# Patient Record
Sex: Male | Born: 1995 | Race: Black or African American | Hispanic: No | Marital: Single | Smoking: Current some day smoker
Health system: Southern US, Community
[De-identification: ages and names within clinical notes are randomized; demographics above are authoritative.]

## PROBLEM LIST (undated history)

## (undated) ENCOUNTER — Emergency Department (HOSPITAL_COMMUNITY): Payer: Self-pay

## (undated) DIAGNOSIS — J45909 Unspecified asthma, uncomplicated: Secondary | ICD-10-CM

## (undated) HISTORY — PX: TRACHEOSTOMY: SUR1362

---
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
## 2017-01-08 ENCOUNTER — Encounter (HOSPITAL_COMMUNITY): Payer: Self-pay | Admitting: Emergency Medicine

## 2017-01-08 ENCOUNTER — Emergency Department (HOSPITAL_COMMUNITY): Payer: Self-pay

## 2017-01-08 ENCOUNTER — Emergency Department (HOSPITAL_COMMUNITY)
Admission: EM | Admit: 2017-01-08 | Discharge: 2017-01-08 | Disposition: A | Discharge status: Home / Self Care | Payer: Self-pay | Attending: Emergency Medicine | Admitting: Emergency Medicine

## 2017-01-08 DIAGNOSIS — F17200 Nicotine dependence, unspecified, uncomplicated: Secondary | ICD-10-CM | POA: Insufficient documentation

## 2017-01-08 DIAGNOSIS — J209 Acute bronchitis, unspecified: Secondary | ICD-10-CM | POA: Insufficient documentation

## 2017-01-08 DIAGNOSIS — J45901 Unspecified asthma with (acute) exacerbation: Secondary | ICD-10-CM | POA: Insufficient documentation

## 2017-01-08 MED ORDER — ALBUTEROL SULFATE HFA 108 (90 BASE) MCG/ACT IN AERS: 2.0000 | INHALATION_SPRAY | RESPIRATORY_TRACT | Status: DC

## 2017-01-08 MED ORDER — PREDNISONE 10 MG PO TABS: ORAL_TABLET | ORAL | 0 refills | Status: DC

## 2017-01-08 MED ORDER — IPRATROPIUM-ALBUTEROL 0.5-2.5 (3) MG/3ML IN SOLN
3.0000 mL | Freq: Once | RESPIRATORY_TRACT | Status: AC
  Administered 2017-01-08: 3 mL via RESPIRATORY_TRACT
  Filled 2017-01-08: qty 3

## 2017-01-08 MED ORDER — PREDNISONE 20 MG PO TABS
60.0000 mg | ORAL_TABLET | Freq: Every day | ORAL | Status: DC
  Administered 2017-01-08: 60 mg via ORAL
  Filled 2017-01-08: qty 3

## 2017-01-08 NOTE — Discharge Instructions (Signed)
Return if any problems.  See your Physician for recheck in 2-3 days °

## 2017-01-08 NOTE — ED Notes (Signed)
Patient was alert, oriented and stable upon discharge. RN went over AVS and patient had no further questions.  

## 2017-01-08 NOTE — ED Provider Notes (Signed)
[facility]-EMERGENCY DEPT Provider Note   CSN: [id number] Arrival date & time: 01/08/17  1656  By signing my name below, I, Nelwyn Salisbury, attest that this documentation has been prepared under the direction and in the presence of non-physician practitioner, Ok Edwards, PA-C. Electronically Signed: Nelwyn Salisbury, Scribe. 01/08/2017. 5:02 PM.  History   Chief Complaint Chief Complaint  Patient presents with  . Cough   The history is provided by the patient. No language interpreter was used.    HPI Comments:  Joshua Buckley is a 21 y.o. male with pmhx of asthma who presents to the Emergency Department by ambulance complaining of frequent, moderate cough which began 5 days ago.  Pt reports associated headache, vomiting, wheezing and chest tightness secondary to cough. No modifying factors indicated. Pt denies any fever. He notes that he smokes 3 cigars a day and occasionally smokes marijuana. Pt notes he has an inhaler but that it has run out.   Past Medical History:  Diagnosis Date  . Asthma     There are no active problems to display for this patient.   History reviewed. No pertinent surgical history.     Home Medications    Prior to Admission medications   Medication Sig Start Date End Date Taking? Authorizing Provider  albuterol (PROVENTIL HFA;VENTOLIN HFA) 108 (90 BASE) MCG/ACT inhaler Inhale 1 puff into the lungs every 6 (six) hours as needed for wheezing or shortness of breath.    Historical Provider, MD  albuterol (PROVENTIL HFA;VENTOLIN HFA) 108 (90 BASE) MCG/ACT inhaler Inhale 2 puffs into the lungs every 4 (four) hours as needed for wheezing or shortness of breath. 02/12/14   Eber Hong, MD  predniSONE (DELTASONE) 20 MG tablet Take 2 tablets (40 mg total) by mouth daily. 02/12/14   Eber Hong, MD    Family History History reviewed. No pertinent family history.  Social History Social History  Substance Use Topics  . Smoking status: Current Some Day Smoker  .  Smokeless tobacco: Not on file  . Alcohol use No     Allergies   Patient has no known allergies.   Review of Systems Review of Systems   Physical Exam Updated Vital Signs BP 147/67 (BP Location: Right Arm)   Pulse 90   Temp 98.8 F (37.1 C) (Oral)   Resp 18   [name] 95%   Physical Exam  Constitutional: He is oriented to person, place, and time. He appears well-developed and well-nourished. No distress.  HENT:  Head: Normocephalic and atraumatic.  Oropharynx erythematous  Eyes: Conjunctivae are normal.  Cardiovascular: Normal rate.   Pulmonary/Chest: Effort normal.  Abdominal: He exhibits no distension.  Neurological: He is alert and oriented to person, place, and time.  Skin: Skin is warm and dry.  Psychiatric: He has a normal mood and affect.  Nursing note and vitals reviewed.    ED Treatments / Results  DIAGNOSTIC STUDIES:  Oxygen Saturation is 95% on RA, normal by my interpretation.    COORDINATION OF CARE:  5:32 PM Discussed treatment plan with pt at bedside which includes a breathing treatment and pt agreed to plan.  Labs (all labs ordered are listed, but only abnormal results are displayed) Labs Reviewed - No data to display  EKG  EKG Interpretation None       Radiology No results found.  Procedures Procedures (including critical care time)  Medications Ordered in ED Medications  ipratropium-albuterol (DUONEB) 0.5-2.5 (3) MG/3ML nebulizer solution 3 mL (not administered)  Initial Impression / Assessment and Plan / ED Course  I have reviewed the triage vital signs and the nursing notes.  Pertinent labs & imaging results that were available during my care of the patient were reviewed by me and considered in my medical decision making (see chart for details).      Pt symptoms consistent with URI. CXR negative for acute infiltrate. Pt will be discharged with symptomatic treatment.  Discussed return precautions.  Pt is hemodynamically  stable & in NAD prior to discharge.  Final Clinical Impressions(s) / ED Diagnoses   Final diagnoses:  Moderate asthma with exacerbation, unspecified whether persistent  Acute bronchitis, unspecified organism    New Prescriptions Discharge Medication List as of 01/08/2017  6:18 PM     Meds ordered this encounter  Medications  . ipratropium-albuterol (DUONEB) 0.5-2.5 (3) MG/3ML nebulizer solution 3 mL  . predniSONE (DELTASONE) tablet 60 mg  . albuterol (PROVENTIL HFA;VENTOLIN HFA) 108 (90 Base) MCG/ACT inhaler 2 puff  . predniSONE (DELTASONE) 10 MG tablet    Sig: 5,4,3,2,1 taper    Dispense:  15 tablet    Refill:  0    Order Specific Question:   Supervising Provider    Answer:   Eber HongMILLER, BRIAN [[id number]]   An After Visit Summary was printed and given to the patient.  I personally performed the services in this documentation, which was scribed in my presence.  The recorded information has been reviewed and considered.   Barnet PallKaren SofiaPAC.    Lonia SkinnerLeslie K Decatur CitySofia, PA-C 01/08/17 1912    Marily MemosJason Mesner, MD 01/08/17 [phone]

## 2017-01-08 NOTE — ED Triage Notes (Signed)
[facility] having a cough x 5 days. Hx of asthma. Alert and oriented. States he has an inhaler at home that is not working.

## 2017-01-08 NOTE — ED Notes (Signed)
PA student at bedside.

## 2018-02-10 ENCOUNTER — Encounter (HOSPITAL_COMMUNITY): Payer: Self-pay

## 2018-02-10 ENCOUNTER — Other Ambulatory Visit: Payer: Self-pay

## 2018-02-10 ENCOUNTER — Emergency Department (HOSPITAL_COMMUNITY)
Admission: EM | Admit: 2018-02-10 | Discharge: 2018-02-10 | Disposition: A | Discharge status: Home / Self Care | Payer: Medicaid - Out of State | Attending: Emergency Medicine | Admitting: Emergency Medicine

## 2018-02-10 DIAGNOSIS — Y929 Unspecified place or not applicable: Secondary | ICD-10-CM | POA: Insufficient documentation

## 2018-02-10 DIAGNOSIS — T148XXA Other injury of unspecified body region, initial encounter: Secondary | ICD-10-CM

## 2018-02-10 DIAGNOSIS — S3120XA Unspecified open wound of penis, initial encounter: Secondary | ICD-10-CM | POA: Insufficient documentation

## 2018-02-10 DIAGNOSIS — F17210 Nicotine dependence, cigarettes, uncomplicated: Secondary | ICD-10-CM | POA: Insufficient documentation

## 2018-02-10 DIAGNOSIS — J45909 Unspecified asthma, uncomplicated: Secondary | ICD-10-CM | POA: Insufficient documentation

## 2018-02-10 DIAGNOSIS — Y939 Activity, unspecified: Secondary | ICD-10-CM | POA: Insufficient documentation

## 2018-02-10 DIAGNOSIS — Z79899 Other long term (current) drug therapy: Secondary | ICD-10-CM | POA: Insufficient documentation

## 2018-02-10 DIAGNOSIS — Y999 Unspecified external cause status: Secondary | ICD-10-CM | POA: Insufficient documentation

## 2018-02-10 DIAGNOSIS — S3994XA Unspecified injury of external genitals, initial encounter: Secondary | ICD-10-CM

## 2018-02-10 DIAGNOSIS — W230XXA Caught, crushed, jammed, or pinched between moving objects, initial encounter: Secondary | ICD-10-CM | POA: Insufficient documentation

## 2018-02-10 MED ORDER — BACITRACIN ZINC 500 UNIT/GM EX OINT: 1.0000 "application " | TOPICAL_OINTMENT | Freq: Two times a day (BID) | CUTANEOUS | 0 refills | Status: DC

## 2018-02-10 MED ORDER — LIDOCAINE 5 % EX OINT: 1.0000 "application " | TOPICAL_OINTMENT | CUTANEOUS | 0 refills | Status: DC | PRN

## 2018-02-10 NOTE — ED Triage Notes (Addendum)
Pt endorses zipping his penis up in his zipper last Saturday night while at the club. Pt has small abrasion to penis. Denies urinary sx. VSS

## 2018-02-10 NOTE — ED Notes (Signed)
Patient complains of penile abrasion after getting foreskin stuck in zipper last weekend.

## 2018-02-10 NOTE — ED Provider Notes (Signed)
[facility] EMERGENCY DEPARTMENT Provider Note   CSN: [id number] Arrival date & time: 02/10/18  1331     History   Chief Complaint Chief Complaint  Patient presents with  . got penis caught in zipper    HPI Joshua Buckley is a 22 y.o. male.  Patient caught his penis in a zipper 3 days ago and has pain at his wound site.  Patient has not had any penile discharge and has no concern for an STD. No urinary symptoms.   The history is provided by the patient.  Illness  This is a new problem. The current episode started more than 2 days ago. The problem occurs hourly. The problem has not changed since onset.Pertinent negatives include no chest pain, no abdominal pain, no headaches and no shortness of breath. Nothing aggravates the symptoms. Nothing relieves the symptoms. He has tried nothing for the symptoms. The treatment provided no relief.    Past Medical History:  Diagnosis Date  . Asthma     There are no active problems to display for this patient.   History reviewed. No pertinent surgical history.      Home Medications    Prior to Admission medications   Medication Sig Start Date End Date Taking? Authorizing Provider  albuterol (PROVENTIL HFA;VENTOLIN HFA) 108 (90 BASE) MCG/ACT inhaler Inhale 1 puff into the lungs every 6 (six) hours as needed for wheezing or shortness of breath.    [provider]  albuterol (PROVENTIL HFA;VENTOLIN HFA) 108 (90 BASE) MCG/ACT inhaler Inhale 2 puffs into the lungs every 4 (four) hours as needed for wheezing or shortness of breath. 02/12/14   Eber Hong, MD  bacitracin ointment Apply 1 application topically 2 (two) times daily. 02/10/18   Adasia Hoar, DO  lidocaine (XYLOCAINE) 5 % ointment Apply 1 application topically as needed. 02/10/18   Janalynn Eder, DO  predniSONE (DELTASONE) 10 MG tablet 5,4,3,2,1 taper 01/08/17   Elson Areas, PA-C    Family History History reviewed. No pertinent family  history.  Social History Social History   Tobacco Use  . Smoking status: Current Some Day Smoker    Packs/day: 0.50    Types: Cigarettes  Substance Use Topics  . Alcohol use: No  . Drug use: Yes    Types: Marijuana    Comment: every 2-3hours     Allergies   Patient has no known allergies.   Review of Systems Review of Systems  Constitutional: Negative for chills and fever.  HENT: Negative for ear pain and sore throat.   Eyes: Negative for pain and visual disturbance.  Respiratory: Negative for cough and shortness of breath.   Cardiovascular: Negative for chest pain and palpitations.  Gastrointestinal: Negative for abdominal pain and vomiting.  Genitourinary: Positive for penile pain. Negative for difficulty urinating, dysuria, hematuria, penile swelling, scrotal swelling, testicular pain and urgency.  Musculoskeletal: Negative for arthralgias and back pain.  Skin: Negative for color change and rash.  Neurological: Negative for seizures, syncope and headaches.  All other systems reviewed and are negative.    Physical Exam Updated Vital Signs  ED Triage Vitals  Enc Vitals Group     BP 02/10/18 1348 136/87     Pulse Rate 02/10/18 1348 93     Resp 02/10/18 1348 19     Temp 02/10/18 1348 97.9 F (36.6 C)     Temp Source 02/10/18 1348 Oral     [name] 02/10/18 1348 97 %     Weight 02/10/18  1348 210 lb (95.3 kg)     Height 02/10/18 1348 5\' 7"  (1.702 m)     Head Circumference --      Peak Flow --      Pain Score 02/10/18 1356 0     Pain Loc --      Pain Edu? --      Excl. in GC? --     Physical Exam  Constitutional: He appears well-developed and well-nourished.  HENT:  Head: Normocephalic and atraumatic.  Eyes: Pupils are equal, round, and reactive to light. Conjunctivae are normal.  Neck: Normal range of motion. Neck supple.  Cardiovascular: Normal rate and regular rhythm.  No murmur heard. Pulmonary/Chest: Effort normal and breath sounds normal. No  respiratory distress.  Abdominal: Soft. There is no tenderness.  Genitourinary:  Genitourinary Comments: Wound on shaft of penis, no drainage, no area of cellulitis  Musculoskeletal: He exhibits no edema.  Neurological: He is alert.  Skin: Skin is warm and dry.  Psychiatric: He has a normal mood and affect.  Nursing note and vitals reviewed.    ED Treatments / Results  Labs (all labs ordered are listed, but only abnormal results are displayed) Labs Reviewed - No data to display  EKG None  Radiology No results found.  Procedures Procedures (including critical care time)  Medications Ordered in ED Medications - No data to display   Initial Impression / Assessment and Plan / ED Course  I have reviewed the triage vital signs and the nursing notes.  Pertinent labs & imaging results that were available during my care of the patient were reviewed by me and considered in my medical decision making (see chart for details).     Manson PasseyJayson Buckley is a 22 year old male with no significant medical history who presents to the ED with penile wound.  Patient with normal vitals.  Patient states that he got his penis caught in his zipper several days ago and has pain at the wound site.  Patient denies any concern for STD.  Patient does not have any dysuria, penile discharge.  Denies any high-risk sexual exposures.  Patient states that he was using the bathroom and his penis accidentally got caught on zipper.  Patient denies any medical problems including diabetes.  Recommend bacitracin or Neosporin.  Patient also given prescription for lidocaine jelly as well.  Given follow-up for PCP.  Discharged from ED in good condition.  Final Clinical Impressions(s) / ED Diagnoses   Final diagnoses:  Avulsion of skin  Injury to penis, initial encounter    ED Discharge Orders        Ordered    lidocaine (XYLOCAINE) 5 % ointment  As needed     02/10/18 1632    bacitracin ointment  2 times daily      02/10/18 1632       Virgina NorfolkCuratolo, Jewel Venditto, DO 02/10/18 [id number]    Blane OharaZavitz, Joshua, MD 02/11/18 0040

## 2018-03-06 ENCOUNTER — Encounter (HOSPITAL_COMMUNITY): Payer: Self-pay | Admitting: Emergency Medicine

## 2018-03-06 ENCOUNTER — Emergency Department (HOSPITAL_COMMUNITY): Payer: Medicaid - Out of State

## 2018-03-06 ENCOUNTER — Emergency Department (HOSPITAL_COMMUNITY)
Admission: EM | Admit: 2018-03-06 | Discharge: 2018-03-06 | Disposition: A | Discharge status: Home / Self Care | Payer: Medicaid - Out of State | Attending: Emergency Medicine | Admitting: Emergency Medicine

## 2018-03-06 ENCOUNTER — Emergency Department (HOSPITAL_COMMUNITY)
Admission: EM | Admit: 2018-03-06 | Discharge: 2018-03-06 | Discharge status: Against Medical Advice | Payer: Medicaid - Out of State

## 2018-03-06 DIAGNOSIS — F17210 Nicotine dependence, cigarettes, uncomplicated: Secondary | ICD-10-CM | POA: Insufficient documentation

## 2018-03-06 DIAGNOSIS — J069 Acute upper respiratory infection, unspecified: Secondary | ICD-10-CM | POA: Insufficient documentation

## 2018-03-06 DIAGNOSIS — J45901 Unspecified asthma with (acute) exacerbation: Secondary | ICD-10-CM

## 2018-03-06 MED ORDER — ALBUTEROL SULFATE HFA 108 (90 BASE) MCG/ACT IN AERS
2.0000 | INHALATION_SPRAY | RESPIRATORY_TRACT | Status: DC | PRN
  Administered 2018-03-06: 2 via RESPIRATORY_TRACT
  Filled 2018-03-06: qty 6.7

## 2018-03-06 NOTE — ED Triage Notes (Signed)
Called pt from lobby No response 1st attempt 

## 2018-03-06 NOTE — Discharge Instructions (Addendum)
You need to follow-up with a primary care doctor for further management of your asthma.

## 2018-03-06 NOTE — ED Triage Notes (Signed)
Pt c/o nasal congestion, cough that is productive and sore throat for couple days. Reports has asthma but doesnt have inhaler.

## 2018-03-06 NOTE — ED Provider Notes (Signed)
[facility]-EMERGENCY DEPT Provider Note   CSN: [id number] Arrival date & time: 03/06/18  0510     History   Chief Complaint Chief Complaint  Patient presents with  . Nasal Congestion  . Cough    HPI Joshua Buckley is a 22 y.o. male.  HPI Patient presents with few day history of shortness of breath and cough.  States he has asthma but does not have anyone management.  States he is comes to the ER.  States he gets worse when the temperature changes and when there is a lot of pollen.  States his head is been congested.  Has had a sore throat.  Has had a cough with yellow and occasionally green sputum production.  No fevers.  Has felt wheezing.  No chest pain. Past Medical History:  Diagnosis Date  . Asthma     There are no active problems to display for this patient.   Past Surgical History:  Procedure Laterality Date  . TRACHEOSTOMY          Home Medications    Prior to Admission medications   Medication Sig Start Date End Date Taking? Authorizing Provider  albuterol (PROVENTIL HFA;VENTOLIN HFA) 108 (90 BASE) MCG/ACT inhaler Inhale 1 puff into the lungs every 6 (six) hours as needed for wheezing or shortness of breath.    [provider]  albuterol (PROVENTIL HFA;VENTOLIN HFA) 108 (90 BASE) MCG/ACT inhaler Inhale 2 puffs into the lungs every 4 (four) hours as needed for wheezing or shortness of breath. 02/12/14   Eber Hong, MD  bacitracin ointment Apply 1 application topically 2 (two) times daily. 02/10/18   Curatolo, Adam, DO  lidocaine (XYLOCAINE) 5 % ointment Apply 1 application topically as needed. 02/10/18   Curatolo, Adam, DO  predniSONE (DELTASONE) 10 MG tablet 5,4,3,2,1 taper 01/08/17   Elson Areas, PA-C    Family History No family history on file.  Social History Social History   Tobacco Use  . Smoking status: Current Some Day Smoker    Packs/day: 0.50    Types: Cigarettes  . Smokeless tobacco: Never Used  Substance  Use Topics  . Alcohol use: No  . Drug use: Yes    Types: Marijuana    Comment: every 2-3hours     Allergies   Patient has no known allergies.   Review of Systems Review of Systems  Constitutional: Negative for appetite change.  HENT: Positive for congestion, rhinorrhea and sore throat. Negative for facial swelling.   Respiratory: Positive for cough, shortness of breath and wheezing.   Cardiovascular: Negative for chest pain.  Gastrointestinal: Negative for abdominal pain.  Skin: Negative for wound.  Neurological: Negative for weakness.  Hematological: Negative for adenopathy.  Psychiatric/Behavioral: Negative for confusion.     Physical Exam Updated Vital Signs BP 119/73 (BP Location: Left Arm)   Pulse 75   Temp 98.9 F (37.2 C) (Oral)   Resp 16   Ht  (1.702 m)   Wt 89 kg (196 lb 3 oz)   [name] 96%   BMI 30.73 kg/m   Physical Exam  Constitutional: He appears well-developed.  HENT:  Minimal posterior pharyngeal erythema without exudate.  Eyes: Pupils are equal, round, and reactive to light.  Neck: Neck supple.  Cardiovascular: Normal rate.  Pulmonary/Chest:  Mildly harsh breath sounds without focal wheezes rales or rhonchi.  Abdominal: There is no tenderness.  Musculoskeletal: He exhibits no edema.  Skin: Skin is warm.     ED Treatments /  Results  Labs (all labs ordered are listed, but only abnormal results are displayed) Labs Reviewed - No data to display  EKG None  Radiology Dg Chest 2 View  Result Date: 03/06/2018 CLINICAL DATA:  Cough and fever for the past week. Current smoker. History of asthma. EXAM: CHEST - 2 VIEW COMPARISON:  Chest x-ray of January 08, 2017 FINDINGS: The lungs are mildly hyperinflated. There is no focal infiltrate. There is no pleural effusion. The heart and pulmonary vascularity are normal. The bony thorax is unremarkable. IMPRESSION: There is no active cardiopulmonary disease. Electronically Signed   By: David  [place] M.D.    On: 03/06/2018 08:10    Procedures Procedures (including critical care time)  Medications Ordered in ED Medications  albuterol (PROVENTIL HFA;VENTOLIN HFA) 108 (90 Base) MCG/ACT inhaler 2 puff (has no administration in time range)     Initial Impression / Assessment and Plan / ED Course  I have reviewed the triage vital signs and the nursing notes.  Pertinent labs & imaging results that were available during my care of the patient were reviewed by me and considered in my medical decision making (see chart for details).     Patient with URI symptoms.  History of asthma.  Lungs sound good.  X-ray no pneumonia.  Will give inhaler discharge home.  Patient was instructed on need to have a primary care provider.  Also is worried by his wisdom teeth coming in.  Final Clinical Impressions(s) / ED Diagnoses   Final diagnoses:  Asthma with acute exacerbation, unspecified asthma severity, unspecified whether persistent  Upper respiratory tract infection, unspecified type    ED Discharge Orders    None       Benjiman Core, MD 03/06/18 1056

## 2018-03-06 NOTE — ED Triage Notes (Signed)
Pt called from triage with no answer 

## 2018-06-08 ENCOUNTER — Emergency Department (HOSPITAL_COMMUNITY): Payer: Medicaid - Out of State

## 2018-06-08 ENCOUNTER — Emergency Department (HOSPITAL_COMMUNITY)
Admission: EM | Admit: 2018-06-08 | Discharge: 2018-06-08 | Disposition: A | Discharge status: Home / Self Care | Payer: Medicaid - Out of State | Attending: Emergency Medicine | Admitting: Emergency Medicine

## 2018-06-08 ENCOUNTER — Other Ambulatory Visit: Payer: Self-pay

## 2018-06-08 DIAGNOSIS — J45901 Unspecified asthma with (acute) exacerbation: Secondary | ICD-10-CM

## 2018-06-08 DIAGNOSIS — J4541 Moderate persistent asthma with (acute) exacerbation: Secondary | ICD-10-CM | POA: Insufficient documentation

## 2018-06-08 LAB — BASIC METABOLIC PANEL
ANION GAP: 13 (ref 5–15)
BUN: 11 mg/dL (ref 6–20)
CALCIUM: 10 mg/dL (ref 8.9–10.3)
CO2: 24 mmol/L (ref 22–32)
Chloride: 106 mmol/L (ref 98–111)
Creatinine, Ser: 1.11 mg/dL (ref 0.61–1.24)
GFR calc Af Amer: >60 mL/min (ref >60)
Glucose, Bld: 111 mg/dL — ABNORMAL HIGH (ref 70–99)
POTASSIUM: 3 mmol/L — AB (ref 3.5–5.1)
SODIUM: 143 mmol/L (ref 135–145)

## 2018-06-08 LAB — CBC
HCT: 47.2 % (ref 39.0–52.0)
Hemoglobin: 16.6 g/dL (ref 13.0–17.0)
MCH: 30.5 pg (ref 26.0–34.0)
MCHC: 35.2 g/dL (ref 30.0–36.0)
MCV: 86.8 fL (ref 78.0–100.0)
PLATELETS: 281 10*3/uL (ref 150–400)
RBC: 5.44 MIL/uL (ref 4.22–5.81)
RDW: 13.1 % (ref 11.5–15.5)
WBC: 11.6 10*3/uL — AB (ref 4.0–10.5)

## 2018-06-08 LAB — D-DIMER, QUANTITATIVE (NOT AT ARMC): D DIMER QUANT: 0.32 ug{FEU}/mL (ref 0.00–0.50)

## 2018-06-08 MED ORDER — SODIUM CHLORIDE 0.9 % IV SOLN: 1000.0000 mL | INTRAVENOUS | Status: DC

## 2018-06-08 MED ORDER — PREDNISONE 50 MG PO TABS: 50.0000 mg | ORAL_TABLET | Freq: Every day | ORAL | 0 refills | Status: DC

## 2018-06-08 MED ORDER — SODIUM CHLORIDE 0.9 % IV BOLUS (SEPSIS)
1000.0000 mL | Freq: Once | INTRAVENOUS | Status: AC
  Administered 2018-06-08: 1000 mL via INTRAVENOUS

## 2018-06-08 NOTE — ED Notes (Signed)
Patient transported to X-ray 

## 2018-06-08 NOTE — ED Notes (Signed)
Bed: WA22 Expected date:  Expected time:  Means of arrival:  Comments: EMS-asthma 

## 2018-06-08 NOTE — ED Notes (Signed)
Patient here for c/o SOB, productive cough, malaise, hx asthma. Tried home nebulizer, no relief. 10 albuterol, 0.5 atrovent given by EMS. Patient states asthma usually flares up after weather changes. Denies any pain, states head "feels loose"  BP: 146/80 HR: 120

## 2018-06-08 NOTE — ED Provider Notes (Signed)
[facility]-EMERGENCY DEPT Provider Note   CSN: [id number] Arrival date & time: 06/08/18  1749     History   Chief Complaint Chief Complaint  Patient presents with  . Asthma  . Cough    HPI Joshua Buckley is a 22 y.o. male.  HPI Patient presents to the emergency room for evaluation of shortness of breath.  Patient states his symptoms started several days ago.  He has had trouble with cough and congestion.  He has been using his breathing treatments but has not noticed much of an improvement.  He has had some generalized malaise as well as some lightheadedness.  He denies any fevers.  No vomiting or diarrhea.  No chest pain.  No leg swelling.  Patient does have a history of asthma and uses his breathing treatments and inhalers.  He does not have a primary doctor that follows his asthma.  Eyes any history of PE or DVT.  No history of heart disease. Past Medical History:  Diagnosis Date  . Asthma     There are no active problems to display for this patient.   Past Surgical History:  Procedure Laterality Date  . TRACHEOSTOMY          Home Medications    Prior to Admission medications   Medication Sig Start Date End Date Taking? Authorizing Provider  ipratropium-albuterol (DUONEB) 0.5-2.5 (3) MG/3ML SOLN Take 3 mLs by nebulization every 6 (six) hours as needed (sob and wheezing).   Yes [provider]  albuterol (PROVENTIL HFA;VENTOLIN HFA) 108 (90 BASE) MCG/ACT inhaler Inhale 1 puff into the lungs every 6 (six) hours as needed for wheezing or shortness of breath.    [provider]  albuterol (PROVENTIL HFA;VENTOLIN HFA) 108 (90 BASE) MCG/ACT inhaler Inhale 2 puffs into the lungs every 4 (four) hours as needed for wheezing or shortness of breath. Patient not taking: Reported on 06/08/2018 02/12/14   Eber Hong, MD  bacitracin ointment Apply 1 application topically 2 (two) times daily. Patient not taking: Reported on 06/08/2018 02/10/18    Virgina Norfolk, DO  lidocaine (XYLOCAINE) 5 % ointment Apply 1 application topically as needed. Patient not taking: Reported on 06/08/2018 02/10/18   Virgina Norfolk, DO  predniSONE (DELTASONE) 10 MG tablet 5,4,3,2,1 taper Patient not taking: Reported on 06/08/2018 01/08/17   Elson Areas, PA-C    Family History No family history on file.  Social History Social History   Tobacco Use  . Smoking status: Current Some Day Smoker    Packs/day: 0.50    Types: Cigarettes  . Smokeless tobacco: Never Used  Substance Use Topics  . Alcohol use: No  . Drug use: Yes    Types: Marijuana    Comment: every 2-3hours     Allergies   Patient has no known allergies.   Review of Systems Review of Systems  All other systems reviewed and are negative.    Physical Exam Updated Vital Signs BP 136/77 (BP Location: Right Arm)   Pulse (!) 128   Temp 99.3 F (37.4 C) (Oral)   Resp 20   Ht 1.727 m (5\' 8" )   Wt 93 kg   [name] 93%   BMI 31.17 kg/m   Physical Exam  Constitutional: He appears well-developed and well-nourished. No distress.  HENT:  Head: Normocephalic and atraumatic.  Right Ear: External ear normal.  Left Ear: External ear normal.  Eyes: Conjunctivae are normal. Right eye exhibits no discharge. Left eye exhibits no discharge. No scleral  icterus.  Neck: Neck supple. No tracheal deviation present.  Cardiovascular: Regular rhythm and intact distal pulses. Tachycardia present.  Pulmonary/Chest: Effort normal and breath sounds normal. No stridor. No respiratory distress. He has no wheezes. He has no rales.  Patient is able to speak in full sentences  Abdominal: Soft. Bowel sounds are normal. He exhibits no distension. There is no tenderness. There is no rebound and no guarding.  Musculoskeletal: He exhibits no edema or tenderness.  Neurological: He is alert. He has normal strength. No cranial nerve deficit (no facial droop, extraocular movements intact, no slurred speech) or sensory  deficit. He exhibits normal muscle tone. He displays no seizure activity. Coordination normal.  Skin: Skin is warm and dry. No rash noted.  Psychiatric: He has a normal mood and affect.  Nursing note and vitals reviewed.    ED Treatments / Results  Labs (all labs ordered are listed, but only abnormal results are displayed) Labs Reviewed  CBC - Abnormal; Notable for the following components:      Result Value   WBC 11.6 (*)    All other components within normal limits  BASIC METABOLIC PANEL - Abnormal; Notable for the following components:   Potassium 3.0 (*)    Glucose, Bld 111 (*)    All other components within normal limits  D-DIMER, QUANTITATIVE (NOT AT [facility])     Radiology Dg Chest 2 View  Result Date: 06/08/2018 CLINICAL DATA:  Shortness of breath.  Productive cough. EXAM: CHEST - 2 VIEW COMPARISON:  Two-view chest x-ray 03/06/2018. FINDINGS: The heart size and mediastinal contours are within normal limits. Both lungs are clear. The visualized skeletal structures are unremarkable. IMPRESSION: No active cardiopulmonary disease. Electronically Signed   By: Marin Robertshristopher  Mattern M.D.   On: 06/08/2018 18:52    Procedures Procedures (including critical care time)  Medications Ordered in ED Medications  sodium chloride 0.9 % bolus 1,000 mL (1,000 mLs Intravenous New Bag/Given 06/08/18 1838)    Followed by  0.9 %  sodium chloride infusion (has no administration in time range)     Initial Impression / Assessment and Plan / ED Course  I have reviewed the triage vital signs and the nursing notes.  Pertinent labs & imaging results that were available during my care of the patient were reviewed by me and considered in my medical decision making (see chart for details).  Clinical Course as of Jun 09 2011  Fri Jun 08, 2018  1954 Hypokalemia likely from the breathing treatment.    [JK]  1954 CXR negative for pna   [JK]    Clinical Course User Index [JK] Linwood DibblesKnapp, Jacora Hopkins, MD     Patient presented to the emergency room for evaluation of cough congestion and shortness of breath.  Patient has a history of asthma.  He was wheezing on EMS arrival and was given a breathing treatment.  On my exam his wheezing had resolved.  Patient's laboratory tests are reassuring.  Chest x-ray does not show pneumonia.  He is breathing easily now and appears comfortable.  His tachycardia has resolved on my exam.  Plan on discharge home with course of steroids.  Discussed outpatient follow-up with her primary care doctor.  Final Clinical Impressions(s) / ED Diagnoses   Final diagnoses:  Moderate asthma with exacerbation, unspecified whether persistent    ED Discharge Orders    None       Linwood DibblesKnapp, Kamile Fassler, MD 06/08/18 2324

## 2018-06-08 NOTE — Discharge Instructions (Addendum)
Continue your breathing treatments, take the steroids as prescribed, follow-up with a primary care Dr. to make sure you are improving

## 2018-10-09 IMAGING — CR DG CHEST 2V
2 series · 2 of 2 positions shown · non-contrast
Comparison: Chest x-ray of January 08, 2017

CLINICAL DATA: Cough and fever for the past week. Current smoker.
History of asthma.

EXAM:
CHEST - 2 VIEW

[w chest pa]
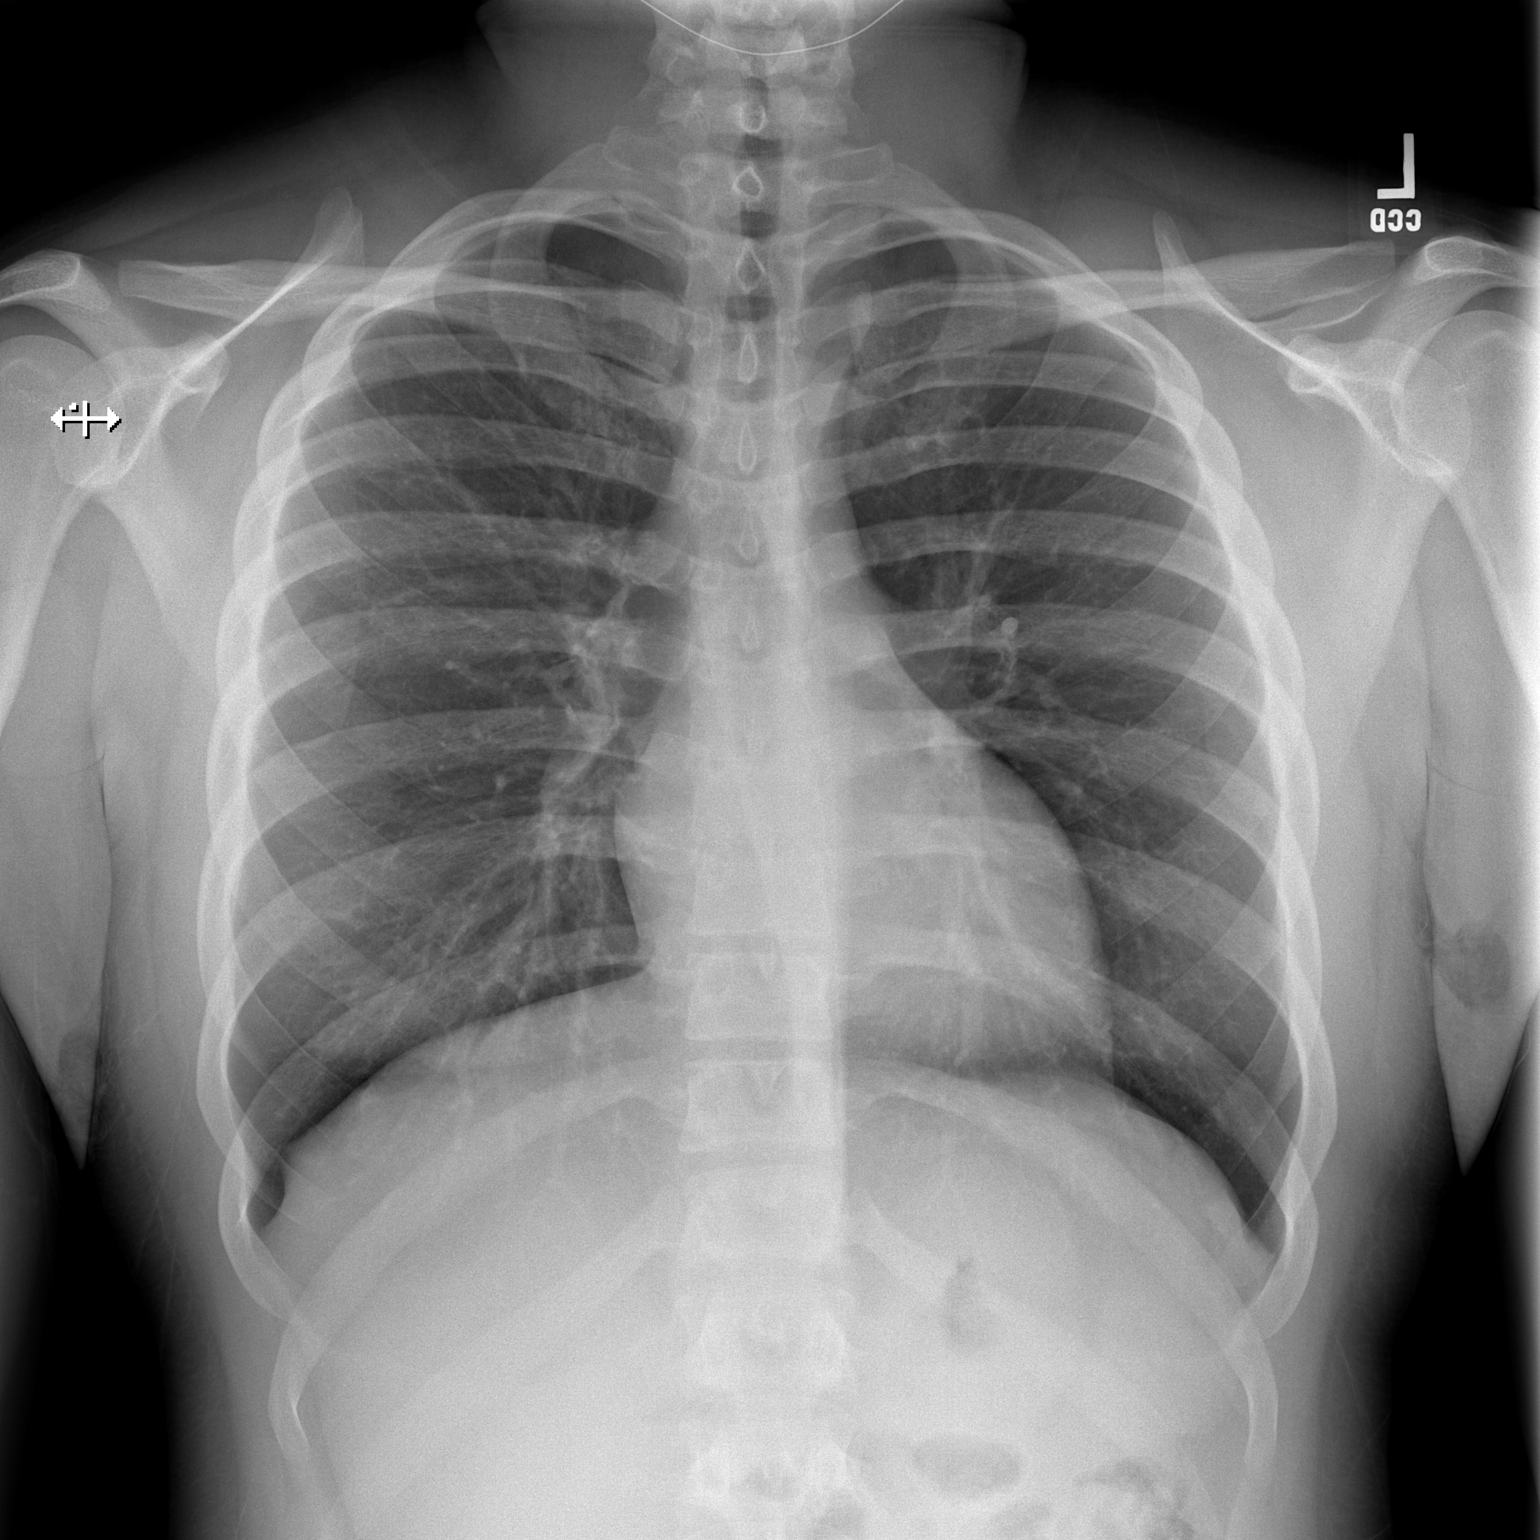

[w chest lat]
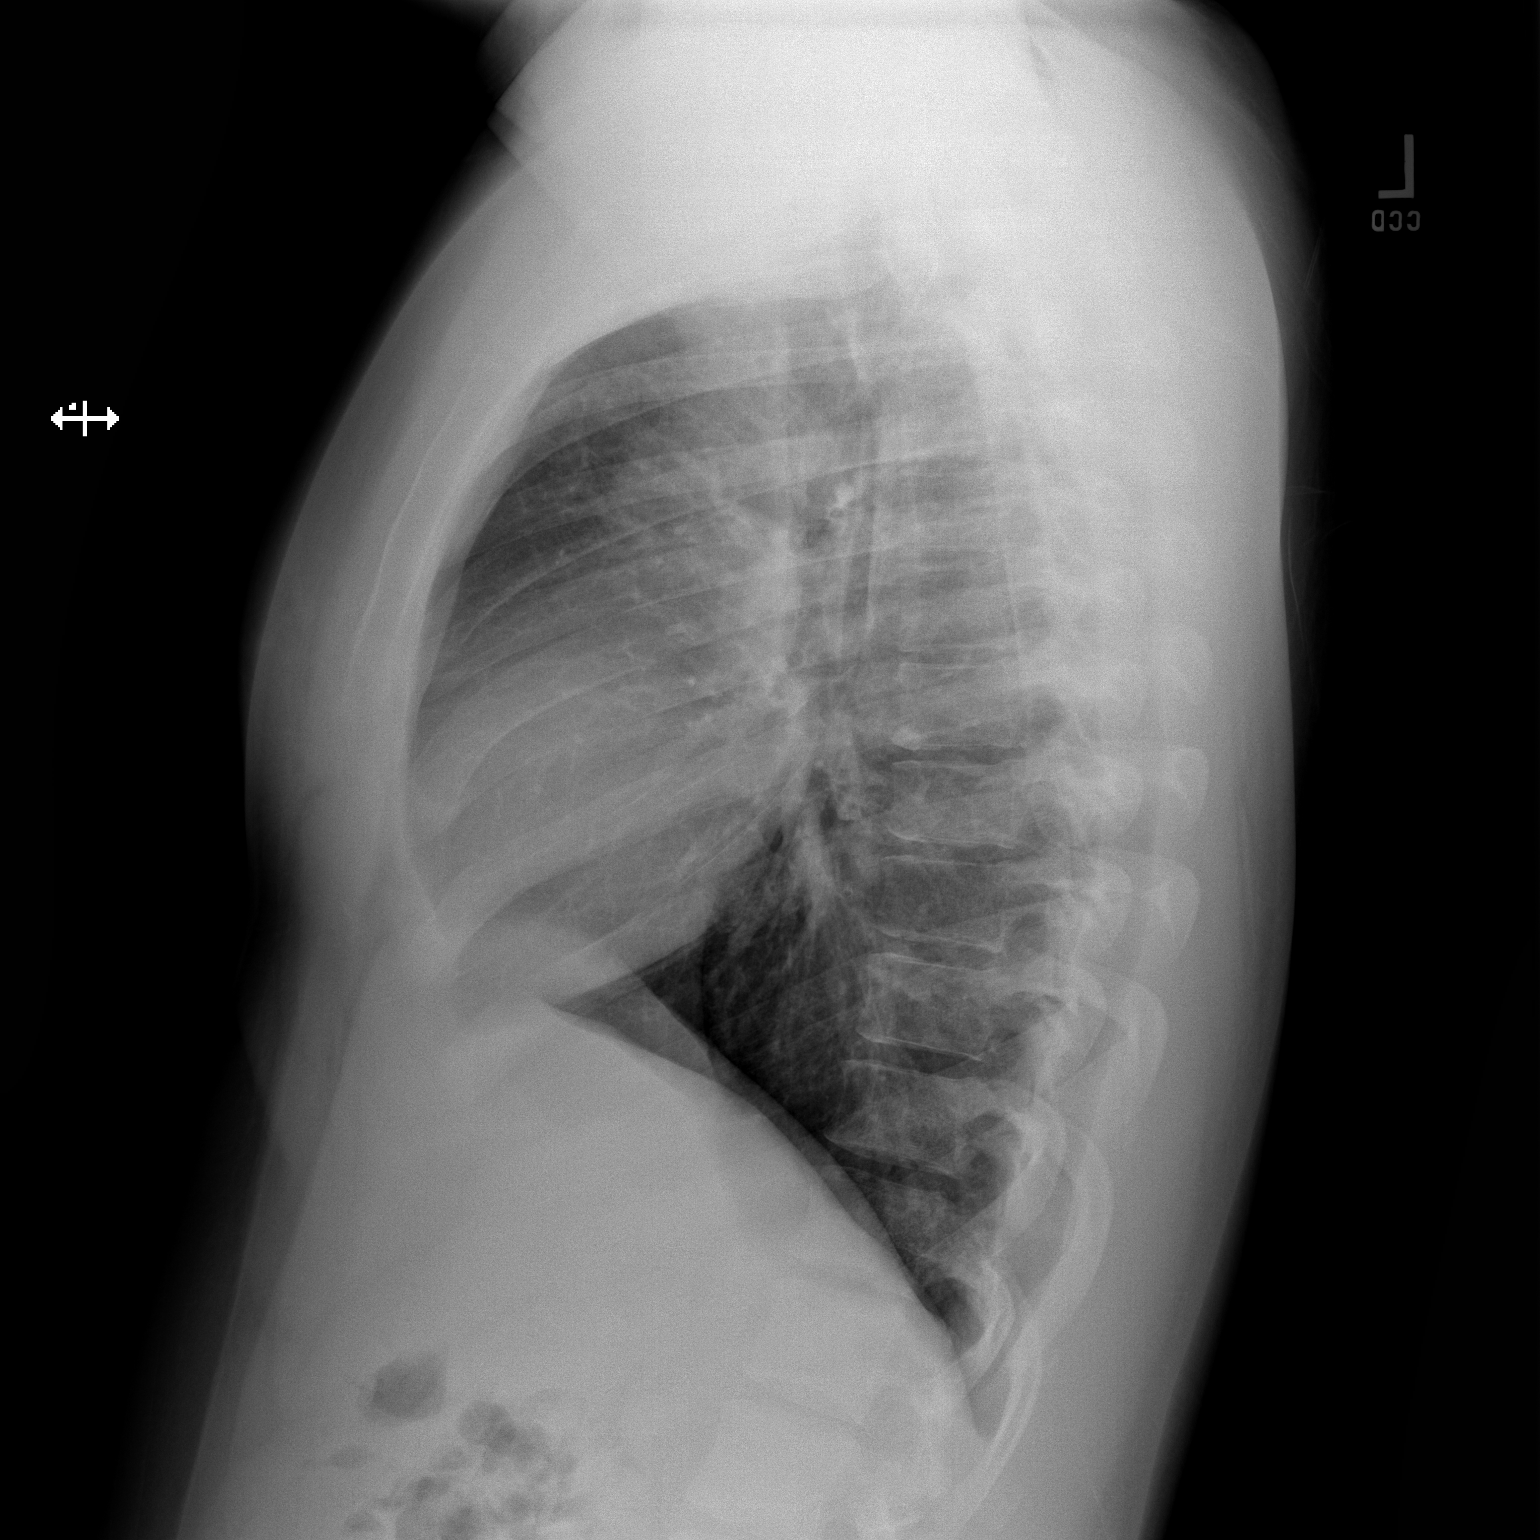

[2 of 2 positions shown; findings below may reference images not displayed]

FINDINGS: The lungs are mildly hyperinflated. There is no focal infiltrate.
There is no pleural effusion. The heart and pulmonary vascularity
are normal. The bony thorax is unremarkable.
IMPRESSION: There is no active cardiopulmonary disease.

## 2018-10-12 ENCOUNTER — Encounter (HOSPITAL_COMMUNITY): Payer: Self-pay

## 2018-10-12 ENCOUNTER — Emergency Department (HOSPITAL_COMMUNITY): Payer: Medicaid - Out of State

## 2018-10-12 ENCOUNTER — Emergency Department (HOSPITAL_COMMUNITY)
Admission: EM | Admit: 2018-10-12 | Discharge: 2018-10-12 | Disposition: A | Discharge status: Home / Self Care | Payer: Medicaid - Out of State | Attending: Emergency Medicine | Admitting: Emergency Medicine

## 2018-10-12 ENCOUNTER — Other Ambulatory Visit: Payer: Self-pay

## 2018-10-12 DIAGNOSIS — Y9367 Activity, basketball: Secondary | ICD-10-CM | POA: Insufficient documentation

## 2018-10-12 DIAGNOSIS — J45909 Unspecified asthma, uncomplicated: Secondary | ICD-10-CM | POA: Insufficient documentation

## 2018-10-12 DIAGNOSIS — S62615A Displaced fracture of proximal phalanx of left ring finger, initial encounter for closed fracture: Secondary | ICD-10-CM | POA: Insufficient documentation

## 2018-10-12 DIAGNOSIS — F17210 Nicotine dependence, cigarettes, uncomplicated: Secondary | ICD-10-CM | POA: Insufficient documentation

## 2018-10-12 DIAGNOSIS — Y999 Unspecified external cause status: Secondary | ICD-10-CM | POA: Insufficient documentation

## 2018-10-12 DIAGNOSIS — Y929 Unspecified place or not applicable: Secondary | ICD-10-CM | POA: Insufficient documentation

## 2018-10-12 DIAGNOSIS — X501XXA Overexertion from prolonged static or awkward postures, initial encounter: Secondary | ICD-10-CM | POA: Insufficient documentation

## 2018-10-12 DIAGNOSIS — Z79899 Other long term (current) drug therapy: Secondary | ICD-10-CM | POA: Insufficient documentation

## 2018-10-12 MED ORDER — IBUPROFEN 800 MG PO TABS: 800.0000 mg | ORAL_TABLET | Freq: Three times a day (TID) | ORAL | 0 refills | Status: DC

## 2018-10-12 MED ORDER — IBUPROFEN 400 MG PO TABS
600.0000 mg | ORAL_TABLET | Freq: Once | ORAL | Status: DC
  Filled 2018-10-12: qty 1

## 2018-10-12 MED ORDER — HYDROCODONE-ACETAMINOPHEN 5-325 MG PO TABS: 1.0000 | ORAL_TABLET | Freq: Four times a day (QID) | ORAL | 0 refills | Status: DC | PRN

## 2018-10-12 NOTE — ED Triage Notes (Signed)
Pt BIBA for c/o left hand pain/swelling x1 week ago after he hurt his hand playing basketball

## 2018-10-12 NOTE — Discharge Instructions (Signed)
Your XRAYs show a comminuted, fracture of the fourth proximal phalanx of your left hand. I have placed you in a splint. Please call the hand specialist (Dr. Mina MarbleWeingold) today to schedule a follow up appointment. Please see attached handouts on splint care and finger fractures.   If you develop worsening or new concerning symptoms you can return to the emergency department for re-evaluation.   For pain control you may take: 800mg  of ibuprofen (that is usually four 200mg  over the counter pills) up to 3 times a day. Please do not take more than this. Do not drink alcohol or combine with other medications that have acetaminophen or Ibuprofen as an ingredient (Read the labels!).    For breakthrough pain you may take Norco. Do not drink alcohol drive or operate heavy machinery when taking. You are being provided a prescription for opiates (also known as narcotics) for pain control on an as needed basis.  Opiates can be addictive and should only be used when absolutely necessary for pain control when other alternatives do not work.  We recommend you only use them for the recommended amount of time and only as prescribed.  Please do not take with other sedative medications or alcohol.  Please do not drive, operate machinery, or make important decisions while taking opiates.  Please note that these medications can be addictive and have high abuse potential.  Please keep these medications locked away from children, teenagers or any family members with history of substance abuse. Additionally, these medications may cause constipation - take over the counter stool softeners or add fiber to your diet to treat this (Metamucil, Psyllium Fiber, Colace, Miralax) Further refills will need to be obtained from your primary care doctor and will not be prescribed through the Emergency Department. You will test positive on most drug tests while taking this medication.

## 2018-10-12 NOTE — ED Provider Notes (Signed)
[facility] Provider Note   CSN: [id number] Arrival date & time: 10/12/18  [id number]     History   Chief Complaint Chief Complaint  Patient presents with  . Hand Pain    HPI Joshua Buckley is a 22 y.o. male with a history of asthma who presents emergency department today for left, dominant hand pain.  Patient reports approximately 1 week ago he was playing basketball when he went up for a rebound.  He reports that the ball either struck his finger or he hit on another player but felt it bend backwards and hyperextended.  He reports immediate pain over his fourth MCP as well as the fourth digit of his left hand.  Patient reports since that time he is tried elevation, ice and has been wrapping the finger in Ace bandage and using popsicle sticks to splint the finger most days. He reports the swelling has gone down but since he has had difficulty with range of motion of the finger, especially with flexion.  He reports pain with movement.  He reports his current pain level is a 5/10 describes as aching.  Patient denies any open wounds.  No numbness/tingling/weakness.  HPI  Past Medical History:  Diagnosis Date  . Asthma     There are no active problems to display for this patient.   Past Surgical History:  Procedure Laterality Date  . TRACHEOSTOMY          Home Medications    Prior to Admission medications   Medication Sig Start Date End Date Taking? Authorizing Provider  albuterol (PROVENTIL HFA;VENTOLIN HFA) 108 (90 BASE) MCG/ACT inhaler Inhale 1 puff into the lungs every 6 (six) hours as needed for wheezing or shortness of breath.    [provider]  albuterol (PROVENTIL HFA;VENTOLIN HFA) 108 (90 BASE) MCG/ACT inhaler Inhale 2 puffs into the lungs every 4 (four) hours as needed for wheezing or shortness of breath. Patient not taking: Reported on 06/08/2018 02/12/14   Eber HongMiller, Brian, MD  bacitracin ointment Apply 1 application topically  2 (two) times daily. Patient not taking: Reported on 06/08/2018 02/10/18   Virgina Norfolkuratolo, Adam, DO  ipratropium-albuterol (DUONEB) 0.5-2.5 (3) MG/3ML SOLN Take 3 mLs by nebulization every 6 (six) hours as needed (sob and wheezing).    [provider]  lidocaine (XYLOCAINE) 5 % ointment Apply 1 application topically as needed. Patient not taking: Reported on 06/08/2018 02/10/18   Virgina Norfolkuratolo, Adam, DO  predniSONE (DELTASONE) 50 MG tablet Take 1 tablet (50 mg total) by mouth daily. 06/08/18   Linwood DibblesKnapp, Jon, MD    Family History No family history on file.  Social History Social History   Tobacco Use  . Smoking status: Current Some Day Smoker    Packs/day: 0.50    Types: Cigarettes  . Smokeless tobacco: Never Used  Substance Use Topics  . Alcohol use: No  . Drug use: Yes    Types: Marijuana    Comment: every 2-3hours     Allergies   Patient has no known allergies.   Review of Systems Review of Systems  Constitutional: Negative for fever.  Musculoskeletal: Positive for arthralgias.  Skin: Negative for wound.  Neurological: Negative for weakness and numbness.     Physical Exam Updated Vital Signs BP 132/80   Pulse 80   Temp 98.3 F (36.8 C) (Oral)   Resp 18   Ht 5\' 7"  (1.702 m)   Wt 87.5 kg   [name] 99%   BMI 30.23 kg/m  Physical Exam Vitals signs and nursing note reviewed.  Constitutional:      Appearance: He is well-developed.  HENT:     Head: Normocephalic and atraumatic.     Right Ear: External ear normal.     Left Ear: External ear normal.  Eyes:     General: No scleral icterus.       Right eye: No discharge.        Left eye: No discharge.     Conjunctiva/sclera: Conjunctivae normal.  Cardiovascular:     Pulses:          Radial pulses are 2+ on the right side and 2+ on the left side.  Pulmonary:     Effort: Pulmonary effort is normal. No respiratory distress.  Musculoskeletal:     Comments: Left hand: No gross deformities, skin intact. Fingers appear  normal. No TTP over flexor sheath. TTP over 4th MCP. Finger adduction/abduction intact. Patinet with intact active and resisted flexion for MCP, PIP and DIP of all fingers. Patient with intact rom of extension to 4th mcp, but decreased/limited extension for active extension of the 4th PIP and DIP. FDS/FDP intact. Radial artery 2+ with <2sec cap refill. SILT in M/U/R distributions.   Skin:    General: Skin is cool.     Capillary Refill: Capillary refill takes less than 2 seconds.     Coloration: Skin is not pale.     Findings: No abrasion, erythema, laceration or wound.     Comments: Skin intact. No erythema.   Neurological:     Mental Status: He is alert.      ED Treatments / Results  Labs (all labs ordered are listed, but only abnormal results are displayed) Labs Reviewed - No data to display  EKG None  Radiology Dg Hand Complete Left  Result Date: 10/12/2018 CLINICAL DATA:  Finger injury while playing basketball last week with persistent pain and swelling. EXAM: LEFT HAND - COMPLETE 3+ VIEW COMPARISON:  None. FINDINGS: There is a obliquely oriented comminuted fracture involving the proximal metaphysis of the proximal phalanx of the fourth digit with slight impaction and angulation, apex anteromedial. No definitive intra-articular extension. Expected adjacent soft tissue swelling. No radiopaque foreign body. Age-indeterminate, though presumably chronic, avulsion fracture of the ulnar styloid process. Note is made of a slight ulnar minus variance. Joint spaces are preserved. No definite erosions. No evidence of chondrocalcinosis. IMPRESSION: 1. Acute comminuted fracture involving the proximal phalanx of the fourth digit without definitive intra-articular extension. 2. Presumably old ulnar styloid process fracture. Correlation for tenderness at this location is recommended. Electronically Signed   By: Simonne Come M.D.   On: 10/12/2018 08:21    Procedures Procedures (including critical  care time)  Medications Ordered in ED Medications  ibuprofen (ADVIL,MOTRIN) tablet 600 mg (600 mg Oral Not Given 10/12/18 0815)     Initial Impression / Assessment and Plan / ED Course  I have reviewed the triage vital signs and the nursing notes.  Pertinent labs & imaging results that were available during my care of the patient were reviewed by me and considered in my medical decision making (see chart for details).     22 y.o. male presenting after hyperextension of his 4th digit of dominant left hand while playing basketball 1 week ago.  Patient reports he has been trying to ice his, elevate it, and use popsicle sticks for splinting.  He reports continued pain, especially after the fourth MCP.  Patient is neurovascular intact on exam.  Initially patient had some decreased extension of the PIP and DIP.  After treating with ibuprofen patient is able to demonstrate full extension of PIP and DIP with active and resisted range of motion.  Range of motion is intact for all flexion and extension of the fourth digit at MCP, PIP and DIP at this current time.  Compartments are soft.  X-rays are with noted comminuted, fracture of the fourth proximal phalanx.  There is no intra-articular articulation. This is a closed fracture. Patient was placed in a ulnar gutter splint.  He remains neurovascular intact.  Will have patient follow-up with hand specialist.  He states that he will call today to make sure he follows up. Will give short supply of pain medication.  Patient reviewed in [place] Controlled Substance Reporting System. I advised the patient to follow-up with hand specialist (Dr. Mina Marble) in the next 1 week. Specific return precautions discussed. Time was given for all questions to be answered. The patient verbalized understanding and agreement with plan. The patient appears safe for discharge home.  Final Clinical Impressions(s) / ED Diagnoses   Final diagnoses:  Closed displaced fracture  of proximal phalanx of left ring finger, initial encounter    ED Discharge Orders         Ordered    HYDROcodone-acetaminophen (NORCO) 5-325 MG tablet  Every 6 hours PRN     10/12/18 0949    ibuprofen (ADVIL,MOTRIN) 800 MG tablet  3 times daily     10/12/18 0949           Jacinto Halim, PA-C 10/12/18 1610    Eber Hong, MD 10/13/18 [id number]

## 2018-10-12 NOTE — Progress Notes (Signed)
Orthopedic Tech Progress Note Patient Details:  Joshua Buckley December 07, 1995 [id number]  Ortho Devices Type of Ortho Device: Arm sling, Ulna gutter splint Ortho Device/Splint Location: lue. Ortho Device/Splint Interventions: Ordered, Application, Adjustment   Post Interventions Patient Tolerated: Well Instructions Provided: Care of device, Adjustment of device After speaking with the dr, he said since the splint would be holding the fingers in the same manner as buddy taping that the buddy taping wouldn't be necessary.  [name], Junette Bernat J 10/12/2018, 9:32 AM

## 2018-10-30 ENCOUNTER — Other Ambulatory Visit: Payer: Self-pay

## 2018-10-30 ENCOUNTER — Encounter (HOSPITAL_COMMUNITY): Payer: Self-pay | Admitting: Emergency Medicine

## 2018-10-30 ENCOUNTER — Emergency Department (HOSPITAL_COMMUNITY): Payer: Self-pay

## 2018-10-30 ENCOUNTER — Emergency Department (HOSPITAL_COMMUNITY)
Admission: EM | Admit: 2018-10-30 | Discharge: 2018-10-30 | Disposition: A | Discharge status: Home / Self Care | Payer: Self-pay | Attending: Emergency Medicine | Admitting: Emergency Medicine

## 2018-10-30 DIAGNOSIS — J111 Influenza due to unidentified influenza virus with other respiratory manifestations: Secondary | ICD-10-CM | POA: Insufficient documentation

## 2018-10-30 DIAGNOSIS — F17210 Nicotine dependence, cigarettes, uncomplicated: Secondary | ICD-10-CM | POA: Insufficient documentation

## 2018-10-30 DIAGNOSIS — R69 Illness, unspecified: Secondary | ICD-10-CM

## 2018-10-30 DIAGNOSIS — Z79899 Other long term (current) drug therapy: Secondary | ICD-10-CM | POA: Insufficient documentation

## 2018-10-30 DIAGNOSIS — J45909 Unspecified asthma, uncomplicated: Secondary | ICD-10-CM | POA: Insufficient documentation

## 2018-10-30 DIAGNOSIS — E876 Hypokalemia: Secondary | ICD-10-CM | POA: Insufficient documentation

## 2018-10-30 LAB — CBC WITH DIFFERENTIAL/PLATELET
Abs Immature Granulocytes: 0.05 10*3/uL (ref 0.00–0.07)
Basophils Absolute: 0 10*3/uL (ref 0.0–0.1)
Basophils Relative: 0 %
Eosinophils Absolute: 0 10*3/uL (ref 0.0–0.5)
Eosinophils Relative: 0 %
HCT: 46.4 % (ref 39.0–52.0)
Hemoglobin: 15.3 g/dL (ref 13.0–17.0)
Immature Granulocytes: 1 %
Lymphocytes Relative: 12 %
Lymphs Abs: 1.2 10*3/uL (ref 0.7–4.0)
MCH: 28.6 pg (ref 26.0–34.0)
MCHC: 33 g/dL (ref 30.0–36.0)
MCV: 86.7 fL (ref 80.0–100.0)
Monocytes Absolute: 0.9 10*3/uL (ref 0.1–1.0)
Monocytes Relative: 9 %
NEUTROS ABS: 8.2 10*3/uL — AB (ref 1.7–7.7)
Neutrophils Relative %: 78 %
Platelets: 223 10*3/uL (ref 150–400)
RBC: 5.35 MIL/uL (ref 4.22–5.81)
RDW: 12.5 % (ref 11.5–15.5)
WBC: 10.4 10*3/uL (ref 4.0–10.5)
nRBC: 0 % (ref 0.0–0.2)

## 2018-10-30 LAB — INFLUENZA PANEL BY PCR (TYPE A & B)
Influenza A By PCR: NEGATIVE
Influenza B By PCR: NEGATIVE

## 2018-10-30 LAB — BASIC METABOLIC PANEL
Anion gap: 12 (ref 5–15)
BUN: 7 mg/dL (ref 6–20)
CO2: 23 mmol/L (ref 22–32)
Calcium: 8.7 mg/dL — ABNORMAL LOW (ref 8.9–10.3)
Chloride: 102 mmol/L (ref 98–111)
Creatinine, Ser: 0.95 mg/dL (ref 0.61–1.24)
GFR calc Af Amer: >60 mL/min (ref >60)
GFR calc non Af Amer: >60 mL/min (ref >60)
Glucose, Bld: 154 mg/dL — ABNORMAL HIGH (ref 70–99)
Potassium: 2.7 mmol/L — CL (ref 3.5–5.1)
SODIUM: 137 mmol/L (ref 135–145)

## 2018-10-30 LAB — TROPONIN I

## 2018-10-30 MED ORDER — AEROCHAMBER Z-STAT PLUS/MEDIUM MISC
1.0000 | Freq: Once | Status: DC
  Filled 2018-10-30: qty 1

## 2018-10-30 MED ORDER — IBUPROFEN 800 MG PO TABS: 800.0000 mg | ORAL_TABLET | Freq: Three times a day (TID) | ORAL | 0 refills | Status: AC | PRN

## 2018-10-30 MED ORDER — ALBUTEROL SULFATE (2.5 MG/3ML) 0.083% IN NEBU
INHALATION_SOLUTION | RESPIRATORY_TRACT | Status: AC
  Filled 2018-10-30: qty 3

## 2018-10-30 MED ORDER — AEROCHAMBER PLUS FLO-VU MISC
1.0000 | Freq: Once | Status: AC
  Administered 2018-10-30: 1
  Filled 2018-10-30: qty 1

## 2018-10-30 MED ORDER — ALBUTEROL SULFATE (2.5 MG/3ML) 0.083% IN NEBU
5.0000 mg | INHALATION_SOLUTION | Freq: Once | RESPIRATORY_TRACT | Status: AC
  Administered 2018-10-30: 5 mg via RESPIRATORY_TRACT
  Filled 2018-10-30: qty 6

## 2018-10-30 MED ORDER — ALBUTEROL SULFATE HFA 108 (90 BASE) MCG/ACT IN AERS
2.0000 | INHALATION_SPRAY | RESPIRATORY_TRACT | Status: DC | PRN
  Administered 2018-10-30: 2 via RESPIRATORY_TRACT
  Filled 2018-10-30: qty 6.7

## 2018-10-30 MED ORDER — IBUPROFEN 800 MG PO TABS
800.0000 mg | ORAL_TABLET | Freq: Once | ORAL | Status: AC
  Administered 2018-10-30: 800 mg via ORAL
  Filled 2018-10-30: qty 1

## 2018-10-30 MED ORDER — ALBUTEROL (5 MG/ML) CONTINUOUS INHALATION SOLN
5.0000 mg/h | INHALATION_SOLUTION | Freq: Once | RESPIRATORY_TRACT | Status: DC
  Filled 2018-10-30: qty 20

## 2018-10-30 MED ORDER — POTASSIUM CHLORIDE CRYS ER 20 MEQ PO TBCR: 20.0000 meq | EXTENDED_RELEASE_TABLET | Freq: Every day | ORAL | 0 refills | Status: AC

## 2018-10-30 MED ORDER — ACETAMINOPHEN 325 MG PO TABS
650.0000 mg | ORAL_TABLET | Freq: Once | ORAL | Status: AC | PRN
  Administered 2018-10-30: 650 mg via ORAL
  Filled 2018-10-30: qty 2

## 2018-10-30 MED ORDER — SODIUM CHLORIDE 0.9 % IV BOLUS: 1000.0000 mL | Freq: Once | INTRAVENOUS | Status: DC

## 2018-10-30 MED ORDER — ALBUTEROL SULFATE (2.5 MG/3ML) 0.083% IN NEBU
5.0000 mg | INHALATION_SOLUTION | Freq: Once | RESPIRATORY_TRACT | Status: AC
  Administered 2018-10-30: 5 mg via RESPIRATORY_TRACT

## 2018-10-30 MED ORDER — POTASSIUM CHLORIDE CRYS ER 20 MEQ PO TBCR
40.0000 meq | EXTENDED_RELEASE_TABLET | Freq: Once | ORAL | Status: AC
  Administered 2018-10-30: 40 meq via ORAL
  Filled 2018-10-30: qty 2

## 2018-10-30 NOTE — ED Provider Notes (Signed)
[facility]-EMERGENCY DEPT Provider Note: Lowella DellJ. Lane Julia Kulzer, MD, FACEP  CSN: [id number] MRN: [id number] ARRIVAL: 10/30/18 at 0345 ROOM: WA19/[facility]19   CHIEF COMPLAINT  Chest Pain and Shortness of Breath   HISTORY OF PRESENT ILLNESS  10/30/18 5:06 AM Diona FoleyJayson Lequita HaltMorgan is a 22 y.o. male with a 5-day history of flulike symptoms.  Specifically he has had body aches, sweats, fever, nasal congestion, cough, shortness of breath, wheezing and chest pain with coughing and deep breathing.  His breathing worsened this morning and he called EMS.  He was given an albuterol treatment on the ambulance prior to arrival.  He was given an additional albuterol breathing treatment in the ED.  He states these have helped but his breathing is not back to normal.  He was also given acetaminophen and ibuprofen for fever.  He states he has not been using his inhaler this morning because he was not at home.  He states he has multiple inhalers at home, "red ones, blue ones, brown ones".  He cannot tell me what medicines they contain or whether they are new or expired.   Past Medical History:  Diagnosis Date  . Asthma     Past Surgical History:  Procedure Laterality Date  . TRACHEOSTOMY      History reviewed. No pertinent family history.  Social History   Tobacco Use  . Smoking status: Current Some Day Smoker    Packs/day: 0.50    Types: Cigarettes  . Smokeless tobacco: Never Used  Substance Use Topics  . Alcohol use: No  . Drug use: Yes    Types: Marijuana    Comment: every 2-3hours    Prior to Admission medications   Medication Sig Start Date End Date Taking? Authorizing Provider  acetaminophen (TYLENOL) 325 MG tablet Take 650 mg by mouth every 6 (six) hours as needed for moderate pain.   Yes [provider]  albuterol (PROVENTIL HFA;VENTOLIN HFA) 108 (90 BASE) MCG/ACT inhaler Inhale 1 puff into the lungs every 6 (six) hours as needed for wheezing or shortness of breath.   Yes [provider]    albuterol (PROVENTIL HFA;VENTOLIN HFA) 108 (90 BASE) MCG/ACT inhaler Inhale 2 puffs into the lungs every 4 (four) hours as needed for wheezing or shortness of breath. Patient not taking: Reported on 06/08/2018 02/12/14   Eber HongMiller, Brian, MD  bacitracin ointment Apply 1 application topically 2 (two) times daily. Patient not taking: Reported on 06/08/2018 02/10/18   Virgina Norfolkuratolo, Adam, DO  HYDROcodone-acetaminophen (NORCO) 5-325 MG tablet Take 1 tablet by mouth every 6 (six) hours as needed. Patient not taking: Reported on 10/30/2018 10/12/18   Maczis, Elmer SowMichael M, PA-C  ibuprofen (ADVIL,MOTRIN) 800 MG tablet Take 1 tablet (800 mg total) by mouth 3 (three) times daily. Patient not taking: Reported on 10/30/2018 10/12/18   Maczis, Elmer SowMichael M, PA-C  lidocaine (XYLOCAINE) 5 % ointment Apply 1 application topically as needed. Patient not taking: Reported on 06/08/2018 02/10/18   Virgina Norfolkuratolo, Adam, DO  predniSONE (DELTASONE) 50 MG tablet Take 1 tablet (50 mg total) by mouth daily. Patient not taking: Reported on 10/30/2018 06/08/18   Linwood DibblesKnapp, Jon, MD    Allergies Patient has no known allergies.   REVIEW OF SYSTEMS  Negative except as noted here or in the History of Present Illness.   PHYSICAL EXAMINATION  Initial Vital Signs Blood pressure 137/81, pulse (!) 135, temperature (!) 102.2 F (39 C), temperature source Oral, resp. rate (!) 23, height 5\' 7"  (1.702 m), weight 95.3 kg, [name] 90 %.  Examination General: Well-developed, well-nourished male in no acute distress; appearance consistent with age of record HENT: normocephalic; atraumatic; nasal congestion Eyes: pupils equal, round and reactive to light; extraocular muscles intact Neck: supple Heart: regular rate and rhythm; tachycardia Lungs: Decreased air movement bilaterally without frank wheezing Abdomen: soft; nondistended; nontender; bowel sounds present Extremities: No deformity; full range of motion; pulses normal Neurologic: Awake, alert and oriented;  motor function intact in all extremities and symmetric; no facial droop Skin: Warm and dry Psychiatric: Normal mood and affect   RESULTS  Summary of this visit's results, reviewed by myself:   EKG Interpretation  Date/Time:  Tuesday October 30 2018 04:14:45 EST Ventricular Rate:  127 PR Interval:    QRS Duration: 88 QT Interval:  355 QTC Calculation: 516 R Axis:   55 Text Interpretation:  Sinus tachycardia Prolonged QT interval No previous ECGs available Confirmed by Paula Libra ([id number]) on 10/30/2018 4:18:06 AM      Laboratory Studies: Results for orders placed or performed during the hospital encounter of 10/30/18 (from the past 24 hour(s))  Basic metabolic panel     Status: Abnormal   Collection Time: 10/30/18  4:09 AM  Result Value Ref Range   Sodium 137 135 - 145 mmol/L   Potassium 2.7 (LL) 3.5 - 5.1 mmol/L   Chloride 102 98 - 111 mmol/L   CO2 23 22 - 32 mmol/L   Glucose, Bld 154 (H) 70 - 99 mg/dL   BUN 7 6 - 20 mg/dL   Creatinine, Ser 0.98 0.61 - 1.24 mg/dL   Calcium 8.7 (L) 8.9 - 10.3 mg/dL   GFR calc non Af Amer >60 >60 mL/min   GFR calc Af Amer >60 >60 mL/min   Anion gap 12 5 - 15  Troponin I - ONCE - STAT     Status: None   Collection Time: 10/30/18  4:09 AM  Result Value Ref Range   Troponin I <0.03 <0.03 ng/mL  CBC with Differential/Platelet     Status: Abnormal   Collection Time: 10/30/18  4:19 AM  Result Value Ref Range   WBC 10.4 4.0 - 10.5 K/uL   RBC 5.35 4.22 - 5.81 MIL/uL   Hemoglobin 15.3 13.0 - 17.0 g/dL   HCT 11.9 14.7 - 82.9 %   MCV 86.7 80.0 - 100.0 fL   MCH 28.6 26.0 - 34.0 pg   MCHC 33.0 30.0 - 36.0 g/dL   RDW 56.2 13.0 - 86.5 %   Platelets 223 150 - 400 K/uL   nRBC 0.0 0.0 - 0.2 %   Neutrophils Relative % 78 %   Neutro Abs 8.2 (H) 1.7 - 7.7 K/uL   Lymphocytes Relative 12 %   Lymphs Abs 1.2 0.7 - 4.0 K/uL   Monocytes Relative 9 %   Monocytes Absolute 0.9 0.1 - 1.0 K/uL   Eosinophils Relative 0 %   Eosinophils Absolute 0.0 0.0 -  0.5 K/uL   Basophils Relative 0 %   Basophils Absolute 0.0 0.0 - 0.1 K/uL   Immature Granulocytes 1 %   Abs Immature Granulocytes 0.05 0.00 - 0.07 K/uL  Influenza panel by PCR (type A & B)     Status: None   Collection Time: 10/30/18  5:03 AM  Result Value Ref Range   Influenza A By PCR NEGATIVE NEGATIVE   Influenza B By PCR NEGATIVE NEGATIVE   Imaging Studies: Dg Chest 2 View  Result Date: 10/30/2018 CLINICAL DATA:  Chest pain and shortness of breath EXAM: CHEST -  2 VIEW COMPARISON:  06/08/2018 FINDINGS: The heart size and mediastinal contours are within normal limits. Both lungs are clear. The visualized skeletal structures are unremarkable. IMPRESSION: No active cardiopulmonary disease. Electronically Signed   By: Deatra RobinsonKevin  Herman M.D.   On: 10/30/2018 04:23    ED COURSE and MDM  Nursing notes and initial vitals signs, including pulse oximetry, reviewed.  Vitals:   10/30/18 0352 10/30/18 0354 10/30/18 0602  BP: 137/81    Pulse: (!) 135    Resp: (!) 23    Temp: (!) 102.2 F (39 C)    TempSrc: Oral    [name]: 90%  97%  Weight:  95.3 kg   Height:  5\' 7"  (1.702 m)    6:41 AM Patient's lungs clear after third neb treatment.  Despite the patient's claim of "mini inhalers" we will provide an albuterol inhaler and AeroChamber and instructed in its use.  We will also replete his potassium.  PROCEDURES    ED DIAGNOSES     ICD-10-CM   1. Influenza-like illness R69   2. Hypokalemia E87.6        Thera Basden, Jonny RuizJohn, MD 10/30/18 [phone]

## 2018-10-30 NOTE — ED Notes (Addendum)
Date and time results received: 10/30/18 0536 Test: Potassium Critical Value: 2.7  Name of Provider Notified: Dr. Read DriversMolpus  Orders Received? Or Actions Taken?: see orders

## 2018-10-30 NOTE — ED Triage Notes (Signed)
Patient is complaining of sob and chest pain. Patient states this started 5 days ago. Patient had 5mg  of albuterol on [facility] truck.

## 2018-10-30 NOTE — ED Notes (Signed)
Patient declines IV at this time-administered ibuprofen for fever-patient given water to drink-flu swab to lab

## 2018-10-30 NOTE — ED Notes (Signed)
Patient given a pitcher of water to drink.

## 2020-03-26 ENCOUNTER — Emergency Department (HOSPITAL_COMMUNITY)
Admission: EM | Admit: 2020-03-26 | Discharge: 2020-03-26 | Discharge status: Against Medical Advice | Payer: Medicaid Other

## 2020-03-26 ENCOUNTER — Other Ambulatory Visit: Payer: Self-pay

## 2020-03-29 ENCOUNTER — Other Ambulatory Visit: Payer: Self-pay

## 2020-03-29 ENCOUNTER — Ambulatory Visit (HOSPITAL_COMMUNITY)
Admission: EM | Admit: 2020-03-29 | Discharge: 2020-03-29 | Disposition: A | Discharge status: Home / Self Care | Payer: Medicaid Other | Attending: Family Medicine | Admitting: Family Medicine

## 2020-03-29 ENCOUNTER — Encounter (HOSPITAL_COMMUNITY): Payer: Self-pay

## 2020-03-29 ENCOUNTER — Ambulatory Visit (INDEPENDENT_AMBULATORY_CARE_PROVIDER_SITE_OTHER): Payer: Medicaid Other

## 2020-03-29 DIAGNOSIS — B349 Viral infection, unspecified: Secondary | ICD-10-CM | POA: Diagnosis not present

## 2020-03-29 DIAGNOSIS — Z20822 Contact with and (suspected) exposure to covid-19: Secondary | ICD-10-CM | POA: Diagnosis not present

## 2020-03-29 DIAGNOSIS — Z79899 Other long term (current) drug therapy: Secondary | ICD-10-CM | POA: Diagnosis not present

## 2020-03-29 DIAGNOSIS — R509 Fever, unspecified: Secondary | ICD-10-CM

## 2020-03-29 DIAGNOSIS — R52 Pain, unspecified: Secondary | ICD-10-CM | POA: Diagnosis present

## 2020-03-29 DIAGNOSIS — F17210 Nicotine dependence, cigarettes, uncomplicated: Secondary | ICD-10-CM | POA: Insufficient documentation

## 2020-03-29 DIAGNOSIS — J45909 Unspecified asthma, uncomplicated: Secondary | ICD-10-CM | POA: Insufficient documentation

## 2020-03-29 DIAGNOSIS — R05 Cough: Secondary | ICD-10-CM

## 2020-03-29 NOTE — Discharge Instructions (Addendum)
Your x-ray did not show any pneumonia This is most likely some sort of viral illness.  We are rechecking you for Covid. Keeping taking Tylenol or ibuprofen for pain and fevers. I am checking some basic labs and will cal you with any abnormal results.

## 2020-03-29 NOTE — ED Triage Notes (Signed)
Pt states he has been feeling bad for past 2 weeks, covid tested last week and was negative. States had fever of 100.9 yesterday

## 2020-03-29 NOTE — ED Provider Notes (Signed)
[facility]    CSN: [id number] Arrival date & time: 03/29/20  1202      History   Chief Complaint Chief Complaint  Patient presents with  . Generalized Body Aches    HPI Joshua Buckley is a 24 y.o. male.   Patient is a 24 year old male with past medical history of asthma.  He presents today for approximately 2 weeks of fever, body aches, headache.  He has been taking Tylenol for symptoms with mild relief.  Slight cough that started today.  Denies any associated chest pain, shortness of breath, sore throat, ear pain, nasal congestion, rhinorrhea, nausea, vomiting, diarrhea or abdominal pain.  Tested negative for Covid a week ago.  ROS per HPI      Past Medical History:  Diagnosis Date  . Asthma     There are no problems to display for this patient.   Past Surgical History:  Procedure Laterality Date  . TRACHEOSTOMY         Home Medications    Prior to Admission medications   Medication Sig Start Date End Date Taking? Authorizing Provider  albuterol (PROVENTIL HFA;VENTOLIN HFA) 108 (90 BASE) MCG/ACT inhaler Inhale 1 puff into the lungs every 6 (six) hours as needed for wheezing or shortness of breath.    [provider]  ibuprofen (ADVIL,MOTRIN) 800 MG tablet Take 1 tablet (800 mg total) by mouth every 8 (eight) hours as needed (for fever or body aches). 10/30/18   Molpus, John, MD  potassium chloride SA (K-DUR,KLOR-CON) 20 MEQ tablet Take 1 tablet (20 mEq total) by mouth daily. 10/30/18   Molpus, Jonny Ruiz, MD    Family History History reviewed. No pertinent family history.  Social History Social History   Tobacco Use  . Smoking status: Current Some Day Smoker    Packs/day: 0.50    Types: Cigarettes  . Smokeless tobacco: Never Used  Substance Use Topics  . Alcohol use: No  . Drug use: Yes    Types: Marijuana    Comment: every 2-3hours     Allergies   Patient has no known allergies.   Review of Systems Review of  Systems   Physical Exam Triage Vital Signs ED Triage Vitals  Enc Vitals Group     BP 03/29/20 1318 132/85     Pulse Rate 03/29/20 1318 (!) 116     Resp 03/29/20 1318 20     Temp 03/29/20 1318 99.9 F (37.7 C)     Temp src --      [name] 03/29/20 1318 93 %     Weight --      Height --      Head Circumference --      Peak Flow --      Pain Score 03/29/20 1316 6     Pain Loc --      Pain Edu? --      Excl. in GC? --    No data found.  Updated Vital Signs BP 132/85   Pulse (!) 116   Temp 99.9 F (37.7 C)   Resp 20   [name] 93%   Visual Acuity Right Eye Distance:   Left Eye Distance:   Bilateral Distance:    Right Eye Near:   Left Eye Near:    Bilateral Near:     Physical Exam Vitals and nursing note reviewed.  Constitutional:      General: He is not in acute distress.    Appearance: Normal appearance. He is not ill-appearing, toxic-appearing  or diaphoretic.  HENT:     Head: Normocephalic and atraumatic.     Nose: Nose normal.     Mouth/Throat:     Pharynx: Oropharynx is clear.  Eyes:     Conjunctiva/sclera: Conjunctivae normal.  Cardiovascular:     Rate and Rhythm: Tachycardia present.  Pulmonary:     Effort: Pulmonary effort is normal.     Breath sounds: No wheezing or rhonchi.     Comments: Decreased breath sounds in all fields Musculoskeletal:        General: Normal range of motion.     Cervical back: Normal range of motion.  Skin:    General: Skin is warm and dry.  Neurological:     Mental Status: He is alert.  Psychiatric:        Mood and Affect: Mood normal.      UC Treatments / Results  Labs (all labs ordered are listed, but only abnormal results are displayed) Labs Reviewed  SARS CORONAVIRUS 2 (TAT 6-24 HRS)  CBC WITH DIFFERENTIAL/PLATELET  COMPREHENSIVE METABOLIC PANEL    EKG   Radiology DG Chest 2 View  Result Date: 03/29/2020 CLINICAL DATA:  Cough and fever for 1 week. EXAM: CHEST - 2 VIEW COMPARISON:  10/30/2018 FINDINGS: The  heart size and mediastinal contours are within normal limits. Both lungs are clear. The visualized skeletal structures are unremarkable. IMPRESSION: No active cardiopulmonary disease. Electronically Signed   By: Marlaine Hind M.D.   On: 03/29/2020 14:09    Procedures Procedures (including critical care time)  Medications Ordered in UC Medications - No data to display  Initial Impression / Assessment and Plan / UC Course  I have reviewed the triage vital signs and the nursing notes.  Pertinent labs & imaging results that were available during my care of the patient were reviewed by me and considered in my medical decision making (see chart for details).      Viral illness X-ray negative for pneumonia Patient has no other complaints or sources of possible infection. Drawing some basic labs based on previous low potassium He has not been eating but has been drinking fluids.  Does not appear to be dehydrated Mildly tachycardic and fever of 99.9 here. Covid swab repeated and pending Return precautions given Final Clinical Impressions(s) / UC Diagnoses   Final diagnoses:  Viral syndrome     Discharge Instructions     Your x-ray did not show any pneumonia This is most likely some sort of viral illness.  We are rechecking you for Covid. Keeping taking Tylenol or ibuprofen for pain and fevers. I am checking some basic labs and will cal you with any abnormal results.     ED Prescriptions    None     PDMP not reviewed this encounter.   Loura Halt A, NP 03/29/20 1425

## 2020-03-30 LAB — SARS CORONAVIRUS 2 (TAT 6-24 HRS): SARS Coronavirus 2: NEGATIVE

## 2022-08-28 ENCOUNTER — Emergency Department (HOSPITAL_COMMUNITY): Payer: Self-pay

## 2022-08-28 ENCOUNTER — Other Ambulatory Visit: Payer: Self-pay

## 2022-08-28 ENCOUNTER — Encounter (HOSPITAL_COMMUNITY): Payer: Self-pay

## 2022-08-28 ENCOUNTER — Emergency Department (HOSPITAL_COMMUNITY)
Admission: EM | Admit: 2022-08-28 | Discharge: 2022-08-28 | Discharge status: Against Medical Advice | Payer: Self-pay | Attending: Emergency Medicine | Admitting: Emergency Medicine

## 2022-08-28 DIAGNOSIS — Z5321 Procedure and treatment not carried out due to patient leaving prior to being seen by health care provider: Secondary | ICD-10-CM | POA: Insufficient documentation

## 2022-08-28 DIAGNOSIS — W502XXA Accidental twist by another person, initial encounter: Secondary | ICD-10-CM | POA: Insufficient documentation

## 2022-08-28 DIAGNOSIS — Y9302 Activity, running: Secondary | ICD-10-CM | POA: Insufficient documentation

## 2022-08-28 DIAGNOSIS — M25571 Pain in right ankle and joints of right foot: Secondary | ICD-10-CM | POA: Insufficient documentation

## 2022-08-28 MED ORDER — HYDROCODONE-ACETAMINOPHEN 5-325 MG PO TABS
1.0000 | ORAL_TABLET | Freq: Once | ORAL | Status: AC
  Administered 2022-08-28: 1 via ORAL
  Filled 2022-08-28: qty 1

## 2022-08-28 NOTE — ED Notes (Signed)
I called patient for vital sign recheck and no one responded 

## 2022-08-28 NOTE — ED Triage Notes (Signed)
Per EMS- patient reports that he was at a party last night and was running when he rolled his right ankle and fell.  Patient has swelling to the right foot and ankle area. + pedal pulse. Patient denies LOC or hitting his head.

## 2022-08-28 NOTE — ED Provider Triage Note (Signed)
Emergency Medicine Provider Triage Evaluation Note  Aiyden Lauderback , a 26 y.o. male  was evaluated in triage.  Pt complains of R sided ankle pain after twisting it last night at a party. Patient denies falling or hitting his head.  Review of Systems  Positive:  Negative:   Physical Exam  BP (!) 167/113 (BP Location: Left Arm)   Pulse (!) 104   Temp 98.8 F (37.1 C) (Oral)   Resp 18   Ht 5\' 6"  (1.676 m)   Wt 94.3 kg   [name] 98% Comment: Simultaneous filing. User may not have seen previous data.  BMI 33.57 kg/m  Gen:   Awake, no distress   Resp:  Normal effort  MSK:   Moves extremities without difficulty  Other:  No obvious deformity. 2+ DP pulse. NV intact.  Medical Decision Making  Medically screening exam initiated at 8:35 AM.  Appropriate orders placed.  Jerald Hennington was informed that the remainder of the evaluation will be completed by another provider, this initial triage assessment does not replace that evaluation, and the importance of remaining in the ED until their evaluation is complete.  Work up initiated   Azucena Cecil, [place] 08/28/22 [phone]

## 2024-06-30 ENCOUNTER — Emergency Department (HOSPITAL_COMMUNITY): Payer: Self-pay

## 2024-06-30 ENCOUNTER — Other Ambulatory Visit: Payer: Self-pay

## 2024-06-30 ENCOUNTER — Emergency Department (HOSPITAL_COMMUNITY)
Admission: EM | Admit: 2024-06-30 | Discharge: 2024-07-01 | Disposition: A | Discharge status: Home / Self Care | Payer: Self-pay | Attending: Emergency Medicine | Admitting: Emergency Medicine

## 2024-06-30 DIAGNOSIS — J45909 Unspecified asthma, uncomplicated: Secondary | ICD-10-CM | POA: Insufficient documentation

## 2024-06-30 DIAGNOSIS — X509XXA Other and unspecified overexertion or strenuous movements or postures, initial encounter: Secondary | ICD-10-CM | POA: Insufficient documentation

## 2024-06-30 DIAGNOSIS — M25572 Pain in left ankle and joints of left foot: Secondary | ICD-10-CM | POA: Insufficient documentation

## 2024-06-30 DIAGNOSIS — F17200 Nicotine dependence, unspecified, uncomplicated: Secondary | ICD-10-CM | POA: Insufficient documentation

## 2024-06-30 NOTE — ED Triage Notes (Signed)
 Pt arrives POV with complaints of left ankle pain after tripping up the stairs an hour prior to arrival. PMS intact. Denies other injuries

## 2024-07-01 MED ORDER — NAPROXEN 250 MG PO TABS
500.0000 mg | ORAL_TABLET | Freq: Once | ORAL | Status: AC
  Administered 2024-07-01: 500 mg via ORAL
  Filled 2024-07-01: qty 2

## 2024-07-01 NOTE — Discharge Instructions (Signed)
 Your x-rays were reassuring.  Please follow-up with orthopedics for further management as needed.  If your pain goes away no further evaluation is needed.  You may use the crutches and boot to reduce weightbearing.  Once pain improves you may use the boot to walk on.  Once pain is gone you may get rid of the boot.  You may use Tylenol  or ibuprofen /Advil  for pain relief at home.  If you develop any life-threatening symptoms return to the emergency department.

## 2024-07-01 NOTE — ED Provider Notes (Signed)
 [place] EMERGENCY DEPARTMENT AT [facility] Provider Note   CSN: [id number] Arrival date & time: 06/30/24  2122     Patient presents with: Ankle Pain   Joshua Buckley is a 28 y.o. male.  Patient presents to the emergency room complaining of left-sided ankle pain secondary to hyperflexing his left ankle while climbing stairs.  Denies other injuries.  Past medical history significant for asthma    Ankle Pain      Prior to Admission medications   Medication Sig Start Date End Date Taking? Authorizing Provider  albuterol  (PROVENTIL  HFA;VENTOLIN  HFA) 108 (90 BASE) MCG/ACT inhaler Inhale 1 puff into the lungs every 6 (six) hours as needed for wheezing or shortness of breath.    [provider]  ibuprofen  (ADVIL ,MOTRIN ) 800 MG tablet Take 1 tablet (800 mg total) by mouth every 8 (eight) hours as needed (for fever or body aches). 10/30/18   Molpus, John, MD  potassium chloride  SA (K-DUR,KLOR-CON ) 20 MEQ tablet Take 1 tablet (20 mEq total) by mouth daily. 10/30/18   Molpus, Norleen, MD    Allergies: Patient has no known allergies.    Review of Systems  Updated Vital Signs BP 134/84 (BP Location: Left Arm)   Pulse 96   Temp 98.5 F (36.9 C)   Resp 20   [name] 95%   Physical Exam Vitals and nursing note reviewed.  HENT:     Head: Normocephalic and atraumatic.  Eyes:     Conjunctiva/sclera: Conjunctivae normal.  Pulmonary:     Effort: Pulmonary effort is normal. No respiratory distress.  Musculoskeletal:        General: Tenderness and signs of injury present. No swelling or deformity. Normal range of motion.     Cervical back: Normal range of motion.     Comments: Grossly normal range of motion of the left foot and ankle.  Patient does have tenderness to palpation of the dorsal tarsal region.  Tenderness is not focal in nature.  No swelling appreciated.  Palpable pedal pulse.  Skin:    General: Skin is dry.     Capillary Refill: Capillary refill takes less  than 2 seconds.  Neurological:     Mental Status: He is alert.  Psychiatric:        Speech: Speech normal.        Behavior: Behavior normal.     (all labs ordered are listed, but only abnormal results are displayed) Labs Reviewed - No data to display  EKG: None  Radiology: DG Ankle Complete Left Result Date: 06/30/2024 CLINICAL DATA:  Pain after trip and fall. EXAM: LEFT ANKLE COMPLETE - 3+ VIEW COMPARISON:  None Available. FINDINGS: There is no evidence of fracture, dislocation, or joint effusion. The ankle mortise is preserved. There is no evidence of arthropathy or other focal bone abnormality. Soft tissues are unremarkable. IMPRESSION: Negative radiographs of the left ankle. Electronically Signed   By: Andrea Gasman M.D.   On: 06/30/2024 22:17     .Ortho Injury Treatment  Date/Time: 07/01/2024 12:11 AM  Performed by: Logan Ubaldo NOVAK, PA-C Authorized by: Logan Ubaldo NOVAK, PA-C   Consent:    Consent obtained:  Verbal   Consent given by:  PatientInjury location: ankle Location details: left ankle Injury type: soft tissue Pre-procedure neurovascular assessment: neurovascularly intact Immobilization: crutches (CAM boot with crutches) Splint Applied by: ED Nurse Post-procedure neurovascular assessment: post-procedure neurovascularly intact      Medications Ordered in the ED  naproxen  (NAPROSYN ) tablet 500 mg (500 mg  Oral Given 07/01/24 0034)                                    Medical Decision Making Amount and/or Complexity of Data Reviewed Radiology: ordered.  Risk Prescription drug management.   This patient presents to the ED for concern of left ankle/foot pain, this involves an extensive number of treatment options, and is a complaint that carries with it a high risk of complications and morbidity.  The differential diagnosis includes fracture, dislocation, soft tissue injury, others   Co morbidities / Chronic conditions that complicate the patient  evaluation  Asthma   Additional history obtained:  Additional history obtained from EMR   Imaging Studies ordered:  I ordered imaging studies including plain films of the left ankle I independently visualized and interpreted imaging which showed no fracture or dislocation I agree with the radiologist interpretation   Problem List / ED Course / Critical interventions / Medication management   I ordered medication including Naprosyn  Reevaluation of the patient after these medicines showed that the patient improved  Social Determinants of Health:  Patient is self-pay, is an occasional smoker   Test / Admission - Considered:  Patient with no acute fracture or dislocation on imaging.  Compartments are soft.  Range of motion grossly intact.  Plan to  Place patient in a cam boot and provided with crutches.  Patient will follow-up with orthopedics for further evaluation as needed.  Patient may use over-the-counter pain medication at home.  Return precautions provided.  Discharged home.      Final diagnoses:  Acute left ankle pain    ED Discharge Orders     None          Logan Ubaldo KATHEE DEVONNA 07/01/24 0056    Raford Lenis, MD 07/01/24 [phone]

## 2024-08-22 ENCOUNTER — Emergency Department (HOSPITAL_COMMUNITY): Payer: MEDICAID | Admitting: Critical Care Medicine

## 2024-08-22 ENCOUNTER — Encounter (HOSPITAL_COMMUNITY): Payer: Self-pay | Admitting: Critical Care Medicine

## 2024-08-22 ENCOUNTER — Encounter (HOSPITAL_COMMUNITY): Admission: EM | Disposition: A | Discharge status: Home / Self Care | Payer: Self-pay | Source: Home / Self Care

## 2024-08-22 ENCOUNTER — Emergency Department (HOSPITAL_COMMUNITY): Payer: Self-pay

## 2024-08-22 ENCOUNTER — Inpatient Hospital Stay (HOSPITAL_COMMUNITY)
Admission: EM | Admit: 2024-08-22 | Discharge: 2024-08-27 | DRG: 329 | Disposition: A | Discharge status: Home / Self Care | Payer: MEDICAID | Attending: General Surgery | Admitting: General Surgery

## 2024-08-22 DIAGNOSIS — E876 Hypokalemia: Secondary | ICD-10-CM | POA: Diagnosis present

## 2024-08-22 DIAGNOSIS — S31109A Unspecified open wound of abdominal wall, unspecified quadrant without penetration into peritoneal cavity, initial encounter: Secondary | ICD-10-CM

## 2024-08-22 DIAGNOSIS — Y249XXA Unspecified firearm discharge, undetermined intent, initial encounter: Secondary | ICD-10-CM | POA: Diagnosis present

## 2024-08-22 DIAGNOSIS — J9811 Atelectasis: Secondary | ICD-10-CM | POA: Diagnosis not present

## 2024-08-22 DIAGNOSIS — D62 Acute posthemorrhagic anemia: Secondary | ICD-10-CM | POA: Diagnosis present

## 2024-08-22 DIAGNOSIS — S31139A Puncture wound of abdominal wall without foreign body, unspecified quadrant without penetration into peritoneal cavity, initial encounter: Principal | ICD-10-CM

## 2024-08-22 DIAGNOSIS — S36898A Other injury of other intra-abdominal organs, initial encounter: Secondary | ICD-10-CM | POA: Diagnosis present

## 2024-08-22 DIAGNOSIS — S31643A Puncture wound with foreign body of abdominal wall, right lower quadrant with penetration into peritoneal cavity, initial encounter: Secondary | ICD-10-CM | POA: Diagnosis present

## 2024-08-22 DIAGNOSIS — W3400XA Accidental discharge from unspecified firearms or gun, initial encounter: Secondary | ICD-10-CM

## 2024-08-22 DIAGNOSIS — Y93K1 Activity, walking an animal: Secondary | ICD-10-CM

## 2024-08-22 DIAGNOSIS — S36591A Other injury of transverse colon, initial encounter: Principal | ICD-10-CM | POA: Diagnosis present

## 2024-08-22 HISTORY — PX: LAPAROTOMY: SHX154

## 2024-08-22 LAB — POCT I-STAT 7, (LYTES, BLD GAS, ICA,H+H)
Acid-base deficit: 4 mmol/L — ABNORMAL HIGH (ref 0.0–2.0)
Acid-base deficit: 4 mmol/L — ABNORMAL HIGH (ref 0.0–2.0)
Bicarbonate: 21.6 mmol/L (ref 20.0–28.0)
Bicarbonate: 22 mmol/L (ref 20.0–28.0)
Calcium, Ion: 1.24 mmol/L (ref 1.15–1.40)
Calcium, Ion: 1.23 mmol/L (ref 1.15–1.40)
HCT: 39 % (ref 39.0–52.0)
HCT: 35 % — ABNORMAL LOW (ref 39.0–52.0)
Hemoglobin: 13.3 g/dL (ref 13.0–17.0)
Hemoglobin: 11.9 g/dL — ABNORMAL LOW (ref 13.0–17.0)
O2 Saturation: 86 %
O2 Saturation: 82 %
Patient temperature: 35.4
Potassium: 2.5 mmol/L — CL (ref 3.5–5.1)
Potassium: 2.8 mmol/L — ABNORMAL LOW (ref 3.5–5.1)
Sodium: 145 mmol/L (ref 135–145)
Sodium: 145 mmol/L (ref 135–145)
TCO2: 23 mmol/L (ref 22–32)
TCO2: 23 mmol/L (ref 22–32)
pCO2 arterial: 41.9 mmHg (ref 32–48)
pCO2 arterial: 41.3 mmHg (ref 32–48)
pH, Arterial: 7.32 — ABNORMAL LOW (ref 7.35–7.45)
pH, Arterial: 7.326 — ABNORMAL LOW (ref 7.35–7.45)
pO2, Arterial: 56 mmHg — ABNORMAL LOW (ref 83–108)
pO2, Arterial: 46 mmHg — ABNORMAL LOW (ref 83–108)

## 2024-08-22 LAB — COMPREHENSIVE METABOLIC PANEL WITH GFR
ALT: 34 U/L (ref 0–44)
AST: 29 U/L (ref 15–41)
Albumin: 4 g/dL (ref 3.5–5.0)
Alkaline Phosphatase: 64 U/L (ref 38–126)
Anion gap: 17 — ABNORMAL HIGH (ref 5–15)
BUN: 7 mg/dL (ref 6–20)
CO2: 18 mmol/L — ABNORMAL LOW (ref 22–32)
Calcium: 9.5 mg/dL (ref 8.9–10.3)
Chloride: 106 mmol/L (ref 98–111)
Creatinine, Ser: 1.23 mg/dL (ref 0.61–1.24)
GFR, Estimated: >60 mL/min (ref >60)
Glucose, Bld: 179 mg/dL — ABNORMAL HIGH (ref 70–99)
Potassium: 2.7 mmol/L — CL (ref 3.5–5.1)
Sodium: 141 mmol/L (ref 135–145)
Total Bilirubin: 0.4 mg/dL (ref 0.0–1.2)
Total Protein: 6.9 g/dL (ref 6.5–8.1)

## 2024-08-22 LAB — TYPE AND SCREEN
ABO/RH(D): A POS
Antibody Screen: NEGATIVE

## 2024-08-22 LAB — I-STAT CHEM 8, ED
BUN: 6 mg/dL (ref 6–20)
Calcium, Ion: 1.06 mmol/L — ABNORMAL LOW (ref 1.15–1.40)
Chloride: 107 mmol/L (ref 98–111)
Creatinine, Ser: 1.2 mg/dL (ref 0.61–1.24)
Glucose, Bld: 179 mg/dL — ABNORMAL HIGH (ref 70–99)
HCT: 46 % (ref 39.0–52.0)
Hemoglobin: 15.6 g/dL (ref 13.0–17.0)
Potassium: 2.5 mmol/L — CL (ref 3.5–5.1)
Sodium: 144 mmol/L (ref 135–145)
TCO2: 22 mmol/L (ref 22–32)

## 2024-08-22 LAB — CBC
HCT: 45.2 % (ref 39.0–52.0)
Hemoglobin: 15 g/dL (ref 13.0–17.0)
MCH: 30 pg (ref 26.0–34.0)
MCHC: 33.2 g/dL (ref 30.0–36.0)
MCV: 90.4 fL (ref 80.0–100.0)
Platelets: 398 K/uL (ref 150–400)
RBC: 5 MIL/uL (ref 4.22–5.81)
RDW: 12.5 % (ref 11.5–15.5)
WBC: 13 K/uL — ABNORMAL HIGH (ref 4.0–10.5)
nRBC: 0 % (ref 0.0–0.2)

## 2024-08-22 LAB — I-STAT CG4 LACTIC ACID, ED: Lactic Acid, Venous: 4.3 mmol/L (ref 0.5–1.9)

## 2024-08-22 LAB — PROTIME-INR
INR: 1 (ref 0.8–1.2)
Prothrombin Time: 14 s (ref 11.4–15.2)

## 2024-08-22 LAB — ETHANOL: Alcohol, Ethyl (B): <15 mg/dL (ref <15)

## 2024-08-22 LAB — ABO/RH: ABO/RH(D): A POS

## 2024-08-22 SURGERY — LAPAROTOMY, EXPLORATORY
Anesthesia: General | Site: Abdomen

## 2024-08-22 MED ORDER — ROCURONIUM BROMIDE 10 MG/ML (PF) SYRINGE
PREFILLED_SYRINGE | INTRAVENOUS | Status: AC
Start: 2024-08-22 — End: 2024-08-22
  Filled 2024-08-22: qty 10

## 2024-08-22 MED ORDER — SUGAMMADEX SODIUM 200 MG/2ML IV SOLN
INTRAVENOUS | Status: DC | PRN
Start: 2024-08-22 — End: 2024-08-22
  Administered 2024-08-22 (×2): 200 mg via INTRAVENOUS

## 2024-08-22 MED ORDER — ONDANSETRON HCL 4 MG/2ML IJ SOLN
INTRAMUSCULAR | Status: AC
Start: 2024-08-22 — End: 2024-08-22
  Filled 2024-08-22: qty 2

## 2024-08-22 MED ORDER — PROPOFOL 10 MG/ML IV BOLUS
INTRAVENOUS | Status: DC | PRN
Start: 2024-08-22 — End: 2024-08-22
  Administered 2024-08-22 (×2): 150 mg via INTRAVENOUS

## 2024-08-22 MED ORDER — SODIUM CHLORIDE 0.9 % IV BOLUS
1000.0000 mL | Freq: Once | INTRAVENOUS | Status: AC
Start: 2024-08-22 — End: 2024-08-22
  Administered 2024-08-22 (×2): 1000 mL via INTRAVENOUS

## 2024-08-22 MED ORDER — SODIUM CHLORIDE 0.9% IV SOLUTION
Freq: Once | INTRAVENOUS | Status: AC
Start: 2024-08-22 — End: 2024-08-23

## 2024-08-22 MED ORDER — FENTANYL CITRATE (PF) 250 MCG/5ML IJ SOLN
INTRAMUSCULAR | Status: DC | PRN
  Administered 2024-08-22: 50 ug via INTRAVENOUS
  Administered 2024-08-22: 100 ug via INTRAVENOUS
  Administered 2024-08-22 (×2): 50 ug via INTRAVENOUS
  Administered 2024-08-22: 100 ug via INTRAVENOUS
  Administered 2024-08-22: 50 ug via INTRAVENOUS

## 2024-08-22 MED ORDER — TETANUS-DIPHTHERIA TOXOIDS TD 5-2 LF/0.5ML IM SUSP: 0.5000 mL | Freq: Once | INTRAMUSCULAR | Status: DC

## 2024-08-22 MED ORDER — DEXAMETHASONE SOD PHOSPHATE PF 10 MG/ML IJ SOLN
INTRAMUSCULAR | Status: DC | PRN
  Administered 2024-08-22 (×2): 4 mg via INTRAVENOUS

## 2024-08-22 MED ORDER — FENTANYL CITRATE (PF) 100 MCG/2ML IJ SOLN: 25.0000 ug | INTRAMUSCULAR | Status: DC | PRN

## 2024-08-22 MED ORDER — ONDANSETRON HCL 4 MG/2ML IJ SOLN
INTRAMUSCULAR | Status: DC | PRN
  Administered 2024-08-22 (×2): 4 mg via INTRAVENOUS

## 2024-08-22 MED ORDER — DEXMEDETOMIDINE HCL IN NACL 80 MCG/20ML IV SOLN
INTRAVENOUS | Status: DC | PRN
  Administered 2024-08-22: 20 ug via INTRAVENOUS
  Administered 2024-08-22: 12 ug via INTRAVENOUS
  Administered 2024-08-22 (×2): 8 ug via INTRAVENOUS
  Administered 2024-08-22: 12 ug via INTRAVENOUS
  Administered 2024-08-22: 20 ug via INTRAVENOUS

## 2024-08-22 MED ORDER — PIPERACILLIN-TAZOBACTAM 3.375 G IVPB 30 MIN
3.3750 g | Freq: Once | INTRAVENOUS | Status: AC
  Administered 2024-08-22 (×2): 3.375 g via INTRAVENOUS

## 2024-08-22 MED ORDER — LIDOCAINE 2% (20 MG/ML) 5 ML SYRINGE
INTRAMUSCULAR | Status: AC
  Filled 2024-08-22: qty 5

## 2024-08-22 MED ORDER — 0.9 % SODIUM CHLORIDE (POUR BTL) OPTIME
TOPICAL | Status: DC | PRN
  Administered 2024-08-22: 3000 mL
  Administered 2024-08-22 (×2): 1000 mL
  Administered 2024-08-22: 3000 mL

## 2024-08-22 MED ORDER — ROCURONIUM BROMIDE 10 MG/ML (PF) SYRINGE
PREFILLED_SYRINGE | INTRAVENOUS | Status: DC | PRN
  Administered 2024-08-22 (×2): 50 mg via INTRAVENOUS

## 2024-08-22 MED ORDER — ONDANSETRON HCL 4 MG/2ML IJ SOLN: 4.0000 mg | Freq: Once | INTRAMUSCULAR | Status: DC | PRN

## 2024-08-22 MED ORDER — MIDAZOLAM HCL 2 MG/2ML IJ SOLN
INTRAMUSCULAR | Status: AC
  Filled 2024-08-22: qty 2

## 2024-08-22 MED ORDER — PHENYLEPHRINE HCL (PRESSORS) 10 MG/ML IV SOLN
INTRAVENOUS | Status: DC | PRN
  Administered 2024-08-22 (×2): 80 ug via INTRAVENOUS

## 2024-08-22 MED ORDER — SUCCINYLCHOLINE CHLORIDE 200 MG/10ML IV SOSY
PREFILLED_SYRINGE | INTRAVENOUS | Status: DC | PRN
  Administered 2024-08-22 (×2): 200 mg via INTRAVENOUS

## 2024-08-22 MED ORDER — FENTANYL CITRATE (PF) 250 MCG/5ML IJ SOLN
INTRAMUSCULAR | Status: AC
  Filled 2024-08-22: qty 5

## 2024-08-22 MED ORDER — OXYCODONE HCL 5 MG/5ML PO SOLN: 5.0000 mg | Freq: Once | ORAL | Status: DC | PRN

## 2024-08-22 MED ORDER — SUCCINYLCHOLINE CHLORIDE 200 MG/10ML IV SOSY
PREFILLED_SYRINGE | INTRAVENOUS | Status: AC
  Filled 2024-08-22: qty 10

## 2024-08-22 MED ORDER — LIDOCAINE 2% (20 MG/ML) 5 ML SYRINGE
INTRAMUSCULAR | Status: DC | PRN
  Administered 2024-08-22 (×2): 100 mg via INTRAVENOUS

## 2024-08-22 MED ORDER — TETANUS-DIPHTH-ACELL PERTUSSIS 5-2-15.5 LF-MCG/0.5 IM SUSP
0.5000 mL | Freq: Once | INTRAMUSCULAR | Status: DC
Start: 2024-08-22 — End: 2024-08-27

## 2024-08-22 MED ORDER — ACETAMINOPHEN 10 MG/ML IV SOLN: 1000.0000 mg | Freq: Once | INTRAVENOUS | Status: DC | PRN

## 2024-08-22 MED ORDER — OXYCODONE HCL 5 MG PO TABS: 5.0000 mg | ORAL_TABLET | Freq: Once | ORAL | Status: DC | PRN

## 2024-08-22 MED ORDER — SODIUM CHLORIDE 0.9 % IV SOLN: INTRAVENOUS | Status: DC | PRN

## 2024-08-22 MED ORDER — LACTATED RINGERS IV SOLN: INTRAVENOUS | Status: DC | PRN

## 2024-08-22 MED ORDER — HYDROMORPHONE HCL 1 MG/ML IJ SOLN: 0.5000 mg | INTRAMUSCULAR | Status: DC | PRN

## 2024-08-22 MED ORDER — MIDAZOLAM HCL 5 MG/5ML IJ SOLN
INTRAMUSCULAR | Status: DC | PRN
  Administered 2024-08-22 (×2): 2 mg via INTRAVENOUS

## 2024-08-22 MED ORDER — PROPOFOL 10 MG/ML IV BOLUS
INTRAVENOUS | Status: AC
  Filled 2024-08-22: qty 20

## 2024-08-22 SURGICAL SUPPLY — 38 items
BAG COUNTER SPONGE SURGICOUNT (BAG) ×1 IMPLANT
BLADE CLIPPER SURG (BLADE) IMPLANT
CANISTER SUCTION 3000ML PPV (SUCTIONS) ×1 IMPLANT
CHLORAPREP W/TINT 26 (MISCELLANEOUS) ×1 IMPLANT
COVER SURGICAL LIGHT HANDLE (MISCELLANEOUS) ×1 IMPLANT
DRAPE LAPAROSCOPIC ABDOMINAL (DRAPES) ×1 IMPLANT
DRAPE WARM FLUID 44X44 (DRAPES) ×1 IMPLANT
DRSG OPSITE POSTOP 4X10 (GAUZE/BANDAGES/DRESSINGS) IMPLANT
DRSG OPSITE POSTOP 4X12 (GAUZE/BANDAGES/DRESSINGS) IMPLANT
DRSG OPSITE POSTOP 4X8 (GAUZE/BANDAGES/DRESSINGS) IMPLANT
ELECT BLADE 6.5 EXT (BLADE) IMPLANT
ELECT CAUTERY BLADE 6.4 (BLADE) ×1 IMPLANT
ELECTRODE REM PT RTRN 9FT ADLT (ELECTROSURGICAL) ×1 IMPLANT
GLOVE BIO SURGEON STRL SZ8 (GLOVE) ×1 IMPLANT
GLOVE BIOGEL PI IND STRL 8 (GLOVE) ×1 IMPLANT
GOWN STRL REUS W/ TWL LRG LVL3 (GOWN DISPOSABLE) ×1 IMPLANT
GOWN STRL REUS W/ TWL XL LVL3 (GOWN DISPOSABLE) ×1 IMPLANT
HANDLE SUCTION POOLE (INSTRUMENTS) ×1 IMPLANT
KIT BASIN OR (CUSTOM PROCEDURE TRAY) ×1 IMPLANT
KIT TURNOVER KIT B (KITS) ×1 IMPLANT
LIGASURE IMPACT 36 18CM CVD LR (INSTRUMENTS) IMPLANT
PACK GENERAL/GYN (CUSTOM PROCEDURE TRAY) ×1 IMPLANT
PAD ARMBOARD POSITIONER FOAM (MISCELLANEOUS) ×1 IMPLANT
RELOAD STAPLE 75 3.8 BLU REG (ENDOMECHANICALS) IMPLANT
SOLN 0.9% NACL POUR BTL 1000ML (IV SOLUTION) ×2 IMPLANT
SPECIMEN JAR LARGE (MISCELLANEOUS) IMPLANT
SPONGE T-LAP 18X18 ~~LOC~~+RFID (SPONGE) IMPLANT
STAPLER GUN LINEAR PROX 60 (STAPLE) IMPLANT
STAPLER PROXIMATE 75MM BLUE (STAPLE) IMPLANT
STAPLER SKIN PROX 35W (STAPLE) ×1 IMPLANT
SUT PDS AB 1 TP1 96 (SUTURE) ×2 IMPLANT
SUT SILK 2 0 SH CR/8 (SUTURE) ×1 IMPLANT
SUT SILK 2 0 TIES 10X30 (SUTURE) ×1 IMPLANT
SUT SILK 3 0 SH CR/8 (SUTURE) ×1 IMPLANT
SUT SILK 3 0 TIES 10X30 (SUTURE) ×1 IMPLANT
TOWEL GREEN STERILE (TOWEL DISPOSABLE) ×1 IMPLANT
TRAY FOLEY MTR SLVR 16FR STAT (SET/KITS/TRAYS/PACK) IMPLANT
YANKAUER SUCT BULB TIP NO VENT (SUCTIONS) IMPLANT
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
## 2024-08-22 NOTE — Transfer of Care (Signed)
 Immediate Anesthesia Transfer of Care Note  Patient: Joshua Buckley  Procedure(s) Performed: LAPAROTOMY, EXPLORATORY, RE[PAIR OF STOMACH, SMALL BOWEL RESECTION, REPAIR OF TRANSVERSE COLON MESENTERY, REPAIR OF SMALL BOWEL MESENTERY (Abdomen)  Patient Location: PACU  Anesthesia Type:General  Level of Consciousness: awake and alert   Airway & Oxygen Therapy: Patient Spontanous Breathing  Post-op Assessment: Report given to RN and Post -op Vital signs reviewed and stable  Post vital signs: Reviewed and stable  Last Vitals:  Vitals Value Taken Time  BP 183/73 08/22/24 23:33  Temp    Pulse 105 08/22/24 23:34  Resp 19 08/22/24 23:33  [name] 93 % 08/22/24 23:34  Vitals shown include unfiled device data.  Last Pain:  Vitals:   08/22/24 2156  TempSrc:   PainSc: 9          Complications: No notable events documented.
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
## 2024-08-22 NOTE — Anesthesia Procedure Notes (Signed)
 Arterial Line Insertion Start/End10/23/2025 10:10 PM, 08/22/2024 10:15 PM Performed by: Roddie Grate, CRNA  Patient location: OR. Preanesthetic checklist: patient identified, IV checked, site marked, risks and benefits discussed, surgical consent, monitors and equipment checked, pre-op evaluation, timeout performed and anesthesia consent Left, radial was placed Hand hygiene performed  and maximum sterile barriers used  Allen's test indicative of satisfactory collateral circulation Attempts: 2 Following insertion, Biopatch and dressing applied. Post procedure assessment: normal  Patient tolerated the procedure well with no immediate complications.
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
## 2024-08-22 NOTE — Anesthesia Procedure Notes (Addendum)
 Procedure Name: Intubation Date/Time: 08/22/2024 10:13 PM  Performed by: Jama Powell NOVAK, CRNAPre-anesthesia Checklist: Patient identified, Timeout performed, Emergency Drugs available, Suction available and Patient being monitored Patient Re-evaluated:Patient Re-evaluated prior to induction Oxygen Delivery Method: Circle system utilized Preoxygenation: Pre-oxygenation with 100% oxygen Induction Type: Rapid sequence and IV induction Laryngoscope Size: Mac and 4 Grade View: Grade I Tube type: Oral Tube size: 7.5 mm Number of attempts: 1 Airway Equipment and Method: Stylet Placement Confirmation: breath sounds checked- equal and bilateral, CO2 detector, positive ETCO2 and ETT inserted through vocal cords under direct vision Secured at: 24 cm Tube secured with: Tape Dental Injury: Teeth and Oropharynx as per pre-operative assessment
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
## 2024-08-23 ENCOUNTER — Other Ambulatory Visit: Payer: Self-pay

## 2024-08-23 ENCOUNTER — Encounter (HOSPITAL_COMMUNITY): Payer: Self-pay | Admitting: General Surgery

## 2024-08-23 ENCOUNTER — Inpatient Hospital Stay (HOSPITAL_COMMUNITY): Payer: MEDICAID

## 2024-08-23 LAB — BASIC METABOLIC PANEL WITH GFR
Anion gap: 13 (ref 5–15)
BUN: 7 mg/dL (ref 6–20)
CO2: 22 mmol/L (ref 22–32)
Calcium: 8.7 mg/dL — ABNORMAL LOW (ref 8.9–10.3)
Chloride: 106 mmol/L (ref 98–111)
Creatinine, Ser: 1.07 mg/dL (ref 0.61–1.24)
GFR, Estimated: >60 mL/min (ref >60)
Glucose, Bld: 135 mg/dL — ABNORMAL HIGH (ref 70–99)
Potassium: 3.8 mmol/L (ref 3.5–5.1)
Sodium: 141 mmol/L (ref 135–145)

## 2024-08-23 LAB — CBC
HCT: 42.4 % (ref 39.0–52.0)
Hemoglobin: 13.9 g/dL (ref 13.0–17.0)
MCH: 29.1 pg (ref 26.0–34.0)
MCHC: 32.8 g/dL (ref 30.0–36.0)
MCV: 88.9 fL (ref 80.0–100.0)
Platelets: 369 K/uL (ref 150–400)
RBC: 4.77 MIL/uL (ref 4.22–5.81)
RDW: 12.6 % (ref 11.5–15.5)
WBC: 20.6 K/uL — ABNORMAL HIGH (ref 4.0–10.5)
nRBC: 0 % (ref 0.0–0.2)

## 2024-08-23 LAB — HIV ANTIBODY (ROUTINE TESTING W REFLEX): HIV Screen 4th Generation wRfx: NONREACTIVE

## 2024-08-23 LAB — MRSA NEXT GEN BY PCR, NASAL: MRSA by PCR Next Gen: NOT DETECTED

## 2024-08-23 LAB — SAMPLE TO BLOOD BANK

## 2024-08-23 MED ORDER — ONDANSETRON HCL 4 MG/2ML IJ SOLN
INTRAMUSCULAR | Status: AC
Start: 2024-08-23 — End: 2024-08-23
  Filled 2024-08-23: qty 2

## 2024-08-23 MED ORDER — ENOXAPARIN SODIUM 30 MG/0.3ML IJ SOSY
30.0000 mg | PREFILLED_SYRINGE | Freq: Two times a day (BID) | INTRAMUSCULAR | Status: DC
  Administered 2024-08-24 – 2024-08-27 (×13): 30 mg via SUBCUTANEOUS
  Filled 2024-08-23 (×7): qty 0.3

## 2024-08-23 MED ORDER — DOCUSATE SODIUM 50 MG/5ML PO LIQD: 100.0000 mg | Freq: Two times a day (BID) | ORAL | Status: DC

## 2024-08-23 MED ORDER — ACETAMINOPHEN 10 MG/ML IV SOLN
1000.0000 mg | Freq: Four times a day (QID) | INTRAVENOUS | Status: AC
  Administered 2024-08-23 – 2024-08-24 (×8): 1000 mg via INTRAVENOUS
  Filled 2024-08-23 (×4): qty 100

## 2024-08-23 MED ORDER — SUCCINYLCHOLINE CHLORIDE 200 MG/10ML IV SOSY
PREFILLED_SYRINGE | INTRAVENOUS | Status: AC
  Filled 2024-08-23: qty 10

## 2024-08-23 MED ORDER — CALCIUM GLUCONATE-NACL 2-0.675 GM/100ML-% IV SOLN
2.0000 g | Freq: Once | INTRAVENOUS | Status: AC
  Administered 2024-08-23 (×2): 2000 mg via INTRAVENOUS
  Filled 2024-08-23: qty 100

## 2024-08-23 MED ORDER — PANTOPRAZOLE SODIUM 40 MG PO TBEC: 40.0000 mg | DELAYED_RELEASE_TABLET | Freq: Every day | ORAL | Status: DC

## 2024-08-23 MED ORDER — ACETAMINOPHEN 500 MG PO TABS: 1000.0000 mg | ORAL_TABLET | Freq: Four times a day (QID) | ORAL | Status: DC

## 2024-08-23 MED ORDER — METHOCARBAMOL 500 MG PO TABS: 500.0000 mg | ORAL_TABLET | Freq: Three times a day (TID) | ORAL | Status: DC

## 2024-08-23 MED ORDER — SODIUM CHLORIDE 0.45 % IV SOLN
INTRAVENOUS | Status: DC
  Filled 2024-08-23 (×2): qty 1000

## 2024-08-23 MED ORDER — MORPHINE SULFATE (PF) 2 MG/ML IV SOLN
2.0000 mg | INTRAVENOUS | Status: DC | PRN
  Administered 2024-08-23 – 2024-08-26 (×6): 2 mg via INTRAVENOUS
  Filled 2024-08-23 (×3): qty 1

## 2024-08-23 MED ORDER — ONDANSETRON 4 MG PO TBDP: 4.0000 mg | ORAL_TABLET | Freq: Four times a day (QID) | ORAL | Status: DC | PRN

## 2024-08-23 MED ORDER — ORAL CARE MOUTH RINSE: 15.0000 mL | OROMUCOSAL | Status: DC | PRN

## 2024-08-23 MED ORDER — LIDOCAINE 2% (20 MG/ML) 5 ML SYRINGE
INTRAMUSCULAR | Status: AC
  Filled 2024-08-23: qty 5

## 2024-08-23 MED ORDER — PANTOPRAZOLE SODIUM 40 MG IV SOLR
40.0000 mg | Freq: Every day | INTRAVENOUS | Status: DC
  Administered 2024-08-23 (×2): 40 mg via INTRAVENOUS
  Filled 2024-08-23: qty 10

## 2024-08-23 MED ORDER — PIPERACILLIN-TAZOBACTAM 3.375 G IVPB
3.3750 g | Freq: Three times a day (TID) | INTRAVENOUS | Status: AC
  Administered 2024-08-23 – 2024-08-27 (×23): 3.375 g via INTRAVENOUS
  Filled 2024-08-23 (×12): qty 50

## 2024-08-23 MED ORDER — HYDROMORPHONE HCL 1 MG/ML IJ SOLN
1.0000 mg | INTRAMUSCULAR | Status: DC | PRN
  Administered 2024-08-23 (×2): 1 mg via INTRAVENOUS
  Filled 2024-08-23: qty 1

## 2024-08-23 MED ORDER — CHLORHEXIDINE GLUCONATE CLOTH 2 % EX PADS
6.0000 | MEDICATED_PAD | Freq: Every day | CUTANEOUS | Status: DC
  Administered 2024-08-23 – 2024-08-26 (×8): 6 via TOPICAL

## 2024-08-23 MED ORDER — METHOCARBAMOL 1000 MG/10ML IJ SOLN
1000.0000 mg | Freq: Three times a day (TID) | INTRAMUSCULAR | Status: DC
  Administered 2024-08-23 – 2024-08-27 (×21): 1000 mg via INTRAVENOUS
  Filled 2024-08-23 (×12): qty 10

## 2024-08-23 MED ORDER — OXYCODONE HCL 5 MG PO TABS: 5.0000 mg | ORAL_TABLET | ORAL | Status: DC | PRN

## 2024-08-23 MED ORDER — DOCUSATE SODIUM 100 MG PO CAPS: 100.0000 mg | ORAL_CAPSULE | Freq: Two times a day (BID) | ORAL | Status: DC

## 2024-08-23 MED ORDER — ONDANSETRON HCL 4 MG/2ML IJ SOLN: 4.0000 mg | Freq: Four times a day (QID) | INTRAMUSCULAR | Status: DC | PRN

## 2024-08-23 MED ORDER — POLYETHYLENE GLYCOL 3350 17 G PO PACK: 17.0000 g | PACK | Freq: Every day | ORAL | Status: DC | PRN

## 2024-08-23 MED ORDER — ROCURONIUM BROMIDE 10 MG/ML (PF) SYRINGE
PREFILLED_SYRINGE | INTRAVENOUS | Status: AC
  Filled 2024-08-23: qty 10

## 2024-08-23 MED ORDER — METHOCARBAMOL 1000 MG/10ML IJ SOLN
500.0000 mg | Freq: Three times a day (TID) | INTRAMUSCULAR | Status: DC
  Administered 2024-08-23 (×2): 500 mg via INTRAVENOUS
  Filled 2024-08-23: qty 10

## 2024-08-23 MED ORDER — FLUCONAZOLE IN SODIUM CHLORIDE 400-0.9 MG/200ML-% IV SOLN
400.0000 mg | INTRAVENOUS | Status: AC
  Administered 2024-08-23 – 2024-08-26 (×8): 400 mg via INTRAVENOUS
  Filled 2024-08-23 (×4): qty 200

## 2024-08-23 MED ORDER — HYDRALAZINE HCL 20 MG/ML IJ SOLN
10.0000 mg | INTRAMUSCULAR | Status: DC | PRN
  Filled 2024-08-23: qty 1

## 2024-08-23 MED ORDER — KETOROLAC TROMETHAMINE 15 MG/ML IJ SOLN
30.0000 mg | Freq: Four times a day (QID) | INTRAMUSCULAR | Status: DC
  Administered 2024-08-23 – 2024-08-27 (×31): 30 mg via INTRAVENOUS
  Filled 2024-08-23 (×17): qty 2

## 2024-08-23 NOTE — Plan of Care (Signed)
  Problem: Education: Goal: Knowledge of General Education information will improve Description: Including pain rating scale, medication(s)/side effects and non-pharmacologic comfort measures Outcome: Progressing   Problem: Health Behavior/Discharge Planning: Goal: Ability to manage health-related needs will improve Outcome: Progressing   Problem: Activity: Goal: Risk for activity intolerance will decrease Outcome: Progressing   Problem: Coping: Goal: Level of anxiety will decrease Outcome: Progressing   Problem: Pain Managment: Goal: General experience of comfort will improve and/or be controlled Outcome: Progressing   Problem: Skin Integrity: Goal: Risk for impaired skin integrity will decrease Outcome: Progressing
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
## 2024-08-23 NOTE — Progress Notes (Signed)
 [employer] Team Note:  Message received to bridle an [name], [name] bridled in left nare at 63 cm.   Olivia Kenning, RD Registered Dietitian  See Amion for more information
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
## 2024-08-23 NOTE — Plan of Care (Signed)

## 2024-08-23 NOTE — Anesthesia Postprocedure Evaluation (Signed)
 Anesthesia Post Note  Patient: Joshua Buckley  Procedure(s) Performed: LAPAROTOMY, EXPLORATORY, RE[PAIR OF STOMACH, SMALL BOWEL RESECTION, REPAIR OF TRANSVERSE COLON MESENTERY, REPAIR OF SMALL BOWEL MESENTERY (Abdomen)     Patient location during evaluation: PACU Anesthesia Type: General Level of consciousness: awake and alert Pain management: pain level controlled Vital Signs Assessment: post-procedure vital signs reviewed and stable Respiratory status: spontaneous breathing, nonlabored ventilation, respiratory function stable and patient connected to nasal cannula oxygen Cardiovascular status: blood pressure returned to baseline and stable Postop Assessment: no apparent nausea or vomiting Anesthetic complications: no   No notable events documented.  Last Vitals:  Vitals:   08/23/24 0030 08/23/24 0045  BP: (!) 148/85 137/77  Pulse: (!) 105   Resp: 17 19  Temp:    [name]: 92% 100%    Last Pain:  Vitals:   08/23/24 0030  TempSrc:   PainSc: 0-No pain                 Lynwood MARLA Cornea
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
## 2024-08-24 LAB — CBC
HCT: 35.3 % — ABNORMAL LOW (ref 39.0–52.0)
Hemoglobin: 11.7 g/dL — ABNORMAL LOW (ref 13.0–17.0)
MCH: 29.4 pg (ref 26.0–34.0)
MCHC: 33.1 g/dL (ref 30.0–36.0)
MCV: 88.7 fL (ref 80.0–100.0)
Platelets: 279 K/uL (ref 150–400)
RBC: 3.98 MIL/uL — ABNORMAL LOW (ref 4.22–5.81)
RDW: 12.7 % (ref 11.5–15.5)
WBC: 13.8 K/uL — ABNORMAL HIGH (ref 4.0–10.5)
nRBC: 0 % (ref 0.0–0.2)

## 2024-08-24 LAB — BASIC METABOLIC PANEL WITH GFR
Anion gap: 10 (ref 5–15)
BUN: 12 mg/dL (ref 6–20)
CO2: 25 mmol/L (ref 22–32)
Calcium: 8.8 mg/dL — ABNORMAL LOW (ref 8.9–10.3)
Chloride: 106 mmol/L (ref 98–111)
Creatinine, Ser: 1.11 mg/dL (ref 0.61–1.24)
GFR, Estimated: >60 mL/min (ref >60)
Glucose, Bld: 116 mg/dL — ABNORMAL HIGH (ref 70–99)
Potassium: 3.5 mmol/L (ref 3.5–5.1)
Sodium: 141 mmol/L (ref 135–145)

## 2024-08-24 MED ORDER — MENTHOL 3 MG MT LOZG
1.0000 | LOZENGE | OROMUCOSAL | Status: DC | PRN
  Filled 2024-08-24: qty 9

## 2024-08-24 MED ORDER — PHENOL 1.4 % MT LIQD
1.0000 | OROMUCOSAL | Status: DC | PRN
  Filled 2024-08-24: qty 177
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
## 2024-08-25 LAB — BASIC METABOLIC PANEL WITH GFR
Anion gap: 12 (ref 5–15)
BUN: 14 mg/dL (ref 6–20)
CO2: 26 mmol/L (ref 22–32)
Calcium: 8.9 mg/dL (ref 8.9–10.3)
Chloride: 104 mmol/L (ref 98–111)
Creatinine, Ser: 0.99 mg/dL (ref 0.61–1.24)
GFR, Estimated: >60 mL/min (ref >60)
Glucose, Bld: 93 mg/dL (ref 70–99)
Potassium: 3.3 mmol/L — ABNORMAL LOW (ref 3.5–5.1)
Sodium: 142 mmol/L (ref 135–145)

## 2024-08-25 LAB — CBC
HCT: 34 % — ABNORMAL LOW (ref 39.0–52.0)
Hemoglobin: 11 g/dL — ABNORMAL LOW (ref 13.0–17.0)
MCH: 29.3 pg (ref 26.0–34.0)
MCHC: 32.4 g/dL (ref 30.0–36.0)
MCV: 90.4 fL (ref 80.0–100.0)
Platelets: 256 K/uL (ref 150–400)
RBC: 3.76 MIL/uL — ABNORMAL LOW (ref 4.22–5.81)
RDW: 13 % (ref 11.5–15.5)
WBC: 12.1 K/uL — ABNORMAL HIGH (ref 4.0–10.5)
nRBC: 0 % (ref 0.0–0.2)
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
## 2024-08-25 NOTE — Plan of Care (Signed)
  Problem: Education: Goal: Knowledge of General Education information will improve Description: Including pain rating scale, medication(s)/side effects and non-pharmacologic comfort measures Outcome: Progressing   Problem: Clinical Measurements: Goal: Will remain free from infection Outcome: Progressing   Problem: Clinical Measurements: Goal: Respiratory complications will improve Outcome: Progressing   Problem: Activity: Goal: Risk for activity intolerance will decrease Outcome: Progressing   Problem: Elimination: Goal: Will not experience complications related to bowel motility Outcome: Progressing Goal: Will not experience complications related to urinary retention Outcome: Progressing

## 2024-08-26 ENCOUNTER — Inpatient Hospital Stay (HOSPITAL_COMMUNITY): Payer: MEDICAID

## 2024-08-26 LAB — BASIC METABOLIC PANEL WITH GFR
Anion gap: 11 (ref 5–15)
Anion gap: 11 (ref 5–15)
BUN: 12 mg/dL (ref 6–20)
BUN: 12 mg/dL (ref 6–20)
CO2: 28 mmol/L (ref 22–32)
CO2: 27 mmol/L (ref 22–32)
Calcium: 8.7 mg/dL — ABNORMAL LOW (ref 8.9–10.3)
Calcium: 8.7 mg/dL — ABNORMAL LOW (ref 8.9–10.3)
Chloride: 102 mmol/L (ref 98–111)
Chloride: 106 mmol/L (ref 98–111)
Creatinine, Ser: 1.02 mg/dL (ref 0.61–1.24)
Creatinine, Ser: 0.96 mg/dL (ref 0.61–1.24)
GFR, Estimated: >60 mL/min (ref >60)
GFR, Estimated: >60 mL/min (ref >60)
Glucose, Bld: 105 mg/dL — ABNORMAL HIGH (ref 70–99)
Glucose, Bld: 128 mg/dL — ABNORMAL HIGH (ref 70–99)
Potassium: 2.9 mmol/L — ABNORMAL LOW (ref 3.5–5.1)
Potassium: 3.2 mmol/L — ABNORMAL LOW (ref 3.5–5.1)
Sodium: 141 mmol/L (ref 135–145)
Sodium: 144 mmol/L (ref 135–145)

## 2024-08-26 LAB — CBC
HCT: 30.9 % — ABNORMAL LOW (ref 39.0–52.0)
Hemoglobin: 10 g/dL — ABNORMAL LOW (ref 13.0–17.0)
MCH: 29.1 pg (ref 26.0–34.0)
MCHC: 32.4 g/dL (ref 30.0–36.0)
MCV: 89.8 fL (ref 80.0–100.0)
Platelets: 260 K/uL (ref 150–400)
RBC: 3.44 MIL/uL — ABNORMAL LOW (ref 4.22–5.81)
RDW: 12.9 % (ref 11.5–15.5)
WBC: 9.6 K/uL (ref 4.0–10.5)
nRBC: 0 % (ref 0.0–0.2)

## 2024-08-26 LAB — SURGICAL PATHOLOGY

## 2024-08-26 MED ORDER — OXYCODONE HCL 5 MG PO TABS
5.0000 mg | ORAL_TABLET | ORAL | Status: DC | PRN
  Filled 2024-08-26: qty 2

## 2024-08-26 MED ORDER — POTASSIUM CHLORIDE 10 MEQ/100ML IV SOLN
10.0000 meq | INTRAVENOUS | Status: AC
  Administered 2024-08-26 (×8): 10 meq via INTRAVENOUS
  Filled 2024-08-26 (×4): qty 100

## 2024-08-26 MED ORDER — BOOST / RESOURCE BREEZE PO LIQD CUSTOM
1.0000 | Freq: Three times a day (TID) | ORAL | Status: DC
  Administered 2024-08-26 – 2024-08-27 (×7): 1 via ORAL
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
## 2024-08-27 ENCOUNTER — Encounter (HOSPITAL_COMMUNITY): Payer: Self-pay

## 2024-08-27 MED ORDER — METHOCARBAMOL 500 MG PO TABS: 500.0000 mg | ORAL_TABLET | Freq: Four times a day (QID) | ORAL | 0 refills | Status: AC | PRN

## 2024-08-27 NOTE — Progress Notes (Signed)
  Progress Note   Date: 08/27/2024  Patient Name: Joshua Buckley        MRN#: [id number]  Potassium was given for hypokalemia
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
## 2024-08-27 NOTE — Progress Notes (Signed)
  Progress Note   Date: 08/27/2024  Patient Name: Joshua Buckley        MRN#: [id number]   The medication of calcium gluconate was given for treatment of the diagnosis/condition of hypocalcemia.
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
## 2024-08-27 NOTE — Discharge Instructions (Signed)
 [facility]      [facility], [place] [phone]  OPEN ABDOMINAL SURGERY: POST OP INSTRUCTIONS  Always review your discharge instruction sheet given to you by the facility where your surgery was performed.  IF YOU HAVE DISABILITY OR FAMILY LEAVE FORMS, YOU MUST BRING THEM TO THE OFFICE FOR PROCESSING.  PLEASE DO NOT GIVE THEM TO YOUR DOCTOR.  A prescription for pain medication may be given to you upon discharge.  Take your pain medication as prescribed, if needed.  If narcotic pain medicine is not needed, then you may take acetaminophen  (Tylenol ) or ibuprofen (Advil) as needed. Take your usually prescribed medications unless otherwise directed. If you need a refill on your pain medication, please contact your pharmacy. They will contact our office to request authorization.  Prescriptions will not be filled after 5pm or on week-ends. You should follow a light diet the first few days after arrival home, such as soup and crackers, pudding, etc.unless your doctor has advised otherwise. A high-fiber, low fat diet can be resumed as tolerated.   Be sure to include lots of fluids daily. Most patients will experience some swelling and bruising on the chest and neck area.  Ice packs will help.  Swelling and bruising can take several days to resolve Most patients will experience some swelling and bruising in the area of the incision. Ice pack will help. Swelling and bruising can take several days to resolve..  It is common to experience some constipation if taking pain medication after surgery.  Increasing fluid intake and taking a stool softener will usually help or prevent this problem from occurring.  A mild laxative (Milk of Magnesia or Miralax) should be taken according to package directions if there are no bowel movements after 48 hours.  You may have steri-strips (small skin tapes) in place directly over the incision.  These strips should be left on the skin for 7-10 days.  If your surgeon used skin  glue on the incision, you may shower in 24 hours.  The glue will flake off over the next 2-3 weeks.  Any sutures or staples will be removed at the office during your follow-up visit. You may find that a light gauze bandage over your incision may keep your staples from being rubbed or pulled. You may shower and replace the bandage daily. ACTIVITIES:  You may resume regular (light) daily activities beginning the next day--such as daily self-care, walking, climbing stairs--gradually increasing activities as tolerated.  You may have sexual intercourse when it is comfortable.  Refrain from any heavy lifting or straining until approved by your doctor. You may drive when you no longer are taking prescription pain medication, you can comfortably wear a seatbelt, and you can safely maneuver your car and apply brakes Return to Work: ___________________________________ Joshua Buckley should see your doctor in the office for a follow-up appointment approximately two weeks after your surgery.  Make sure that you call for this appointment within a day or two after you arrive home to insure a convenient appointment time. OTHER INSTRUCTIONS:  _____________________________________________________________ _____________________________________________________________  WHEN TO CALL YOUR DOCTOR: Fever over 101.0 Inability to urinate Nausea and/or vomiting Extreme swelling or bruising Continued bleeding from incision. Increased pain, redness, or drainage from the incision. Difficulty swallowing or breathing Muscle cramping or spasms. Numbness or tingling in hands or feet or around lips.  The clinic staff is available to answer your questions during regular business hours.  Please don't hesitate to call and ask to speak to one of  the nurses if you have concerns.  For further questions, please visit [email or web address]

## 2024-08-27 NOTE — Progress Notes (Signed)
  Progress Note   Date: 08/27/2024  Patient Name: Joshua Buckley        MRN#: [id number]  Review the patient's clinical findings supports the diagnosis of:   Acute blood loss anemia
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
## 2024-08-27 NOTE — Plan of Care (Signed)
